# Patient Record
Sex: Female | Born: 1985 | Race: Black or African American | Hispanic: No | Marital: Single | Smoking: Never smoker
Health system: Southern US, Community
[De-identification: ages and names within clinical notes are randomized; demographics above are authoritative.]

## PROBLEM LIST (undated history)

## (undated) DIAGNOSIS — J189 Pneumonia, unspecified organism: Secondary | ICD-10-CM

## (undated) DIAGNOSIS — N179 Acute kidney failure, unspecified: Secondary | ICD-10-CM

## (undated) DIAGNOSIS — E669 Obesity, unspecified: Secondary | ICD-10-CM

## (undated) DIAGNOSIS — I10 Essential (primary) hypertension: Secondary | ICD-10-CM

## (undated) HISTORY — PX: CHOLECYSTECTOMY: SHX55

---

## 2012-04-26 ENCOUNTER — Emergency Department (HOSPITAL_COMMUNITY)
Admission: EM | Admit: 2012-04-26 | Discharge: 2012-04-26 | Disposition: A | Discharge status: Home / Self Care | Payer: 59 | Attending: Emergency Medicine | Admitting: Emergency Medicine

## 2012-04-26 ENCOUNTER — Encounter (HOSPITAL_COMMUNITY): Payer: Self-pay | Admitting: *Deleted

## 2012-04-26 DIAGNOSIS — N898 Other specified noninflammatory disorders of vagina: Secondary | ICD-10-CM | POA: Insufficient documentation

## 2012-04-26 DIAGNOSIS — N390 Urinary tract infection, site not specified: Secondary | ICD-10-CM | POA: Insufficient documentation

## 2012-04-26 LAB — URINE MICROSCOPIC-ADD ON

## 2012-04-26 LAB — WET PREP, GENITAL: Yeast Wet Prep HPF POC: NONE SEEN

## 2012-04-26 LAB — URINALYSIS, ROUTINE W REFLEX MICROSCOPIC
Bilirubin Urine: NEGATIVE
Glucose, UA: NEGATIVE mg/dL
Ketones, ur: NEGATIVE mg/dL
pH: 5.5 (ref 5.0–8.0)

## 2012-04-26 MED ORDER — SULFAMETHOXAZOLE-TRIMETHOPRIM 800-160 MG PO TABS: 1.0000 | ORAL_TABLET | Freq: Two times a day (BID) | ORAL | Status: DC

## 2012-04-26 MED ORDER — SULFAMETHOXAZOLE-TMP DS 800-160 MG PO TABS
1.0000 | ORAL_TABLET | Freq: Once | ORAL | Status: AC
  Administered 2012-04-26: 1 via ORAL
  Filled 2012-04-26: qty 1

## 2012-04-26 NOTE — ED Provider Notes (Signed)
History     CSN: [id number]  Arrival date & time 04/26/12  0024   First MD Initiated Contact with Patient 04/26/12 0133      Chief Complaint  Patient presents with  . Dysuria    (Consider location/radiation/quality/duration/timing/severity/associated sxs/prior treatment) HPI Comments: 26 year old female who states that on Monday she started having burning when she urinated. This has been persistent over 3 days, gradually worsening, it is associated with vaginal discharge which is increased and has an abnormal smell. She denies abdominal pain, nausea vomiting fevers chills or flank pain. She denies any history of sexual transmitted diseases and states that she is sexually active but does not feel that she is at risk for STDs.  Patient is a 26 y.o. female presenting with dysuria. The history is provided by the patient.  Dysuria  Pertinent negatives include no nausea.    History reviewed. No pertinent past medical history.  Past Surgical History  Procedure Date  . Cholecystectomy   . Cesarean section     No family history on file.  History  Substance Use Topics  . Smoking status: Never Smoker   . Smokeless tobacco: Not on file  . Alcohol Use: No    OB History    Grav Para Term Preterm Abortions TAB SAB Ect Mult Living                  Review of Systems  Constitutional: Negative for fever.  Gastrointestinal: Negative for nausea and abdominal pain.  Genitourinary: Positive for dysuria and vaginal discharge. Negative for vaginal pain.  Skin: Negative for rash.    Allergies  Review of patient's allergies indicates no known allergies.  Home Medications   Current Outpatient Rx  Name  Route  Sig  Dispense  Refill  . SULFAMETHOXAZOLE-TRIMETHOPRIM 800-160 MG PO TABS   Oral   Take 1 tablet by mouth every 12 (twelve) hours.   10 tablet   0     BP 137/90  Pulse 94  Temp 98.1 F (36.7 C) (Oral)  Resp 16  Ht 5\' 7"  (1.702 m)  Wt 260 lb (117.935 kg)  BMI 40.72  kg/m2  SpO2 98%  LMP 04/03/2012  Physical Exam  Nursing note and vitals reviewed. Constitutional: She appears well-developed and well-nourished. No distress.  HENT:  Head: Normocephalic and atraumatic.  Mouth/Throat: Oropharynx is clear and moist. No oropharyngeal exudate.  Eyes: Conjunctivae normal and EOM are normal. Pupils are equal, round, and reactive to light. Right eye exhibits no discharge. Left eye exhibits no discharge. No scleral icterus.  Neck: Normal range of motion. Neck supple. No JVD present. No thyromegaly present.  Cardiovascular: Normal rate, regular rhythm, normal heart sounds and intact distal pulses.  Exam reveals no gallop and no friction rub.   No murmur heard. Pulmonary/Chest: Effort normal and breath sounds normal. No respiratory distress. She has no wheezes. She has no rales.  Abdominal: Soft. Bowel sounds are normal. She exhibits no distension and no mass. There is no tenderness.       No abdominal tenderness  Genitourinary:       No tenderness on bimanual exam, mild to moderate vaginal discharge, no bleeding, no adnexal tenderness or masses  Musculoskeletal: Normal range of motion. She exhibits no edema and no tenderness.  Lymphadenopathy:    She has no cervical adenopathy.  Neurological: She is alert. Coordination normal.  Skin: Skin is warm and dry. No rash noted. No erythema.  Psychiatric: She has a normal mood and affect.  Her behavior is normal.    ED Course  Procedures (including critical care time)  Labs Reviewed  URINALYSIS, ROUTINE W REFLEX MICROSCOPIC - Abnormal; Notable for the following:    APPearance CLOUDY (*)     Hgb urine dipstick SMALL (*)     Leukocytes, UA MODERATE (*)     All other components within normal limits  URINE MICROSCOPIC-ADD ON - Abnormal; Notable for the following:    Squamous Epithelial / LPF MANY (*)     Bacteria, UA FEW (*)     All other components within normal limits  WET PREP, GENITAL - Abnormal; Notable for the  following:    Clue Cells Wet Prep HPF POC RARE (*)     WBC, Wet Prep HPF POC FEW (*)     All other components within normal limits  PREGNANCY, URINE  URINE CULTURE  GC/CHLAMYDIA PROBE AMP   No results found.   1. UTI (lower urinary tract infection)       MDM  Well-appearing, normal vital signs, urinalysis suggest urinary infection, 21-50 white blood cells with a few bacteria but many squamous cells. Moderate leukocyte esterase. Will check gonorrhea Chlamydia and wet prep, otherwise patient appears stable.  Urinalysis reviewed, wet prep reviewed, GC Chlamydia pending, patient likely has urinary infection, stable, Bactrim, discharge        Vida Roller, MD 04/26/12 [phone]

## 2012-04-26 NOTE — ED Notes (Signed)
Pt complains of dysuria x 3 days. Pt complains of foul smelling urine.

## 2012-04-26 NOTE — ED Notes (Signed)
Pelvic cart at bedside. 

## 2012-04-26 NOTE — ED Notes (Signed)
Pt c/o burning with urination x 3 days

## 2012-04-27 LAB — URINE CULTURE

## 2012-04-27 LAB — GC/CHLAMYDIA PROBE AMP: GC Probe RNA: NEGATIVE

## 2012-04-28 NOTE — ED Notes (Signed)
+  Urine. Patient treated with Septra DS. Sensitive to same. Per protocol MD. °

## 2012-04-29 ENCOUNTER — Other Ambulatory Visit (HOSPITAL_COMMUNITY)
Admission: RE | Admit: 2012-04-29 | Discharge: 2012-04-29 | Disposition: A | Discharge status: Home / Self Care | Payer: 59 | Source: Ambulatory Visit | Attending: Obstetrics and Gynecology | Admitting: Obstetrics and Gynecology

## 2012-04-29 ENCOUNTER — Other Ambulatory Visit: Payer: Self-pay | Admitting: Obstetrics and Gynecology

## 2012-04-29 DIAGNOSIS — Z01419 Encounter for gynecological examination (general) (routine) without abnormal findings: Secondary | ICD-10-CM | POA: Insufficient documentation

## 2012-10-16 ENCOUNTER — Other Ambulatory Visit: Payer: Self-pay | Admitting: Obstetrics & Gynecology

## 2012-10-16 DIAGNOSIS — N611 Abscess of the breast and nipple: Secondary | ICD-10-CM

## 2012-10-17 ENCOUNTER — Encounter (INDEPENDENT_AMBULATORY_CARE_PROVIDER_SITE_OTHER): Payer: Self-pay

## 2012-10-17 ENCOUNTER — Ambulatory Visit
Admission: RE | Admit: 2012-10-17 | Discharge: 2012-10-17 | Disposition: A | Discharge status: Home / Self Care | Payer: Self-pay | Source: Ambulatory Visit | Attending: Obstetrics & Gynecology | Admitting: Obstetrics & Gynecology

## 2012-10-17 ENCOUNTER — Ambulatory Visit
Admission: RE | Admit: 2012-10-17 | Discharge: 2012-10-17 | Disposition: A | Discharge status: Home / Self Care | Payer: BC Managed Care – PPO | Source: Ambulatory Visit | Attending: Obstetrics & Gynecology | Admitting: Obstetrics & Gynecology

## 2012-10-17 ENCOUNTER — Ambulatory Visit (INDEPENDENT_AMBULATORY_CARE_PROVIDER_SITE_OTHER): Payer: BC Managed Care – PPO | Admitting: Surgery

## 2012-10-17 ENCOUNTER — Encounter (INDEPENDENT_AMBULATORY_CARE_PROVIDER_SITE_OTHER): Payer: Self-pay | Admitting: Surgery

## 2012-10-17 VITALS — BP 122/72 | HR 86 | Temp 98.4°F | Resp 17 | Ht 66.0 in | Wt 289.2 lb

## 2012-10-17 DIAGNOSIS — N611 Abscess of the breast and nipple: Secondary | ICD-10-CM

## 2012-10-17 DIAGNOSIS — N61 Mastitis without abscess: Secondary | ICD-10-CM

## 2012-10-17 NOTE — Progress Notes (Signed)
General Surgery [facility]  Chief Complaint  Patient presents with  . New Evaluation    eval r breast absess - referral from Dr. Hulan Saas, [facility]    HISTORY: The patient is a 27 year old female who presents on referral from the radiologist with a right breast abscess. Patient has a previous history of right breast abscess which spontaneously drained approximately 1-1/2 years ago. Over the past week the patient has noted pain in the right breast with swelling and induration. She was seen by her primary care physician and started on oral antibiotics. She was given a two-week supply. Patient was sent to the radiology Center where an ultrasound of the breast was performed. This showed a abscess cavity measuring 3.3 cm at the medial aspect of the right areola. This was the same site as her prior abscess. An attempt was made at needle aspiration without success. Patient is now referred for surgical for incision and drainage.  History reviewed. No pertinent past medical history.   Current Outpatient Prescriptions  Medication Sig Dispense Refill  . cephALEXin (KEFLEX) 500 MG capsule Take 500 mg by mouth 2 (two) times daily.       No current facility-administered medications for this visit.     No Known Allergies   Family History  Problem Relation Age of Onset  . Hyperlipidemia Mother   . Heart disease Mother   . Hyperlipidemia Father      History   Social History  . Marital Status: Single    Spouse Name: N/A    Number of Children: N/A  . Years of Education: N/A   Social History Main Topics  . Smoking status: Current Every Day Smoker -- 0.25 packs/day  . Smokeless tobacco: Never Used  . Alcohol Use: Yes     Comment: everyday  . Drug Use: No  . Sexually Active: None   Other Topics Concern  . None   Social History Narrative  . None     REVIEW OF SYSTEMS - PERTINENT POSITIVES ONLY: Previous right breast abscess with  spontaneous drainage. Increasing right breast pain of 5 days' duration  EXAM: Filed Vitals:   10/17/12 1632  BP: 122/72  Pulse: 86  Temp: 98.4 F (36.9 C)  Resp: 17    HEENT: normocephalic; pupils equal and reactive; sclerae clear; dentition good; mucous membranes moist NECK:  symmetric on extension; no palpable anterior or posterior cervical lymphadenopathy; no supraclavicular masses; no tenderness CHEST: clear to auscultation bilaterally without rales, rhonchi, or wheezes CARDIAC: regular rate and rhythm without significant murmur; peripheral pulses are full BREAST: Right breast with obvious induration and erythema consistent with cellulitis involving the nipple areolar complex and the medial aspect of the right breast; mild fluctuance at site of previous scar on right breast at the edge of the right areolar located medially EXT:  non-tender without edema; no deformity NEURO: no gross focal deficits; no sign of tremor   LABORATORY RESULTS: See [facility] (CHL-Epic) for most recent results   RADIOLOGY RESULTS: See [facility] (CHL-Epic) for most recent results   PROCEDURE: Under aseptic conditions using local anesthetic, a 2 cm incision is made into the roof of the abscess cavity along the edge of the right areola medially. Dark purulent fluid is evacuated. Abscess cavity is packed with quarter inch iodoform gauze packing and dry gauze dressings are placed.   IMPRESSION: Right breast abscess, recurrent  PLAN: Patient will take her full course of Keflex as prescribed by  her primary care physician. Wound care instructions are given. Patient will remove the packing in 48-72 hours. She will continue to dress the wound until there is no further drainage.  Patient will return for surgical care as needed.  Velora Heckler, MD, FACS General & Endocrine Surgery [facility]   Visit Diagnoses: 1. Breast abscess, right     Primary Care  Physician: Genia Del, MD

## 2012-10-17 NOTE — Patient Instructions (Signed)
Changed external dressings as needed for continued drainage.  Remove packing from the wound in 48-72 hours.  Continue to shower and dressed the wound until there is no further drainage.  Take entire course of oral antibiotics as prescribed by your primary care physician.  Velora Heckler, MD, [facility] Office: [phone]

## 2012-10-21 ENCOUNTER — Ambulatory Visit (INDEPENDENT_AMBULATORY_CARE_PROVIDER_SITE_OTHER): Payer: BC Managed Care – PPO | Admitting: General Surgery

## 2012-10-21 ENCOUNTER — Encounter (INDEPENDENT_AMBULATORY_CARE_PROVIDER_SITE_OTHER): Payer: Self-pay

## 2012-10-21 ENCOUNTER — Encounter (INDEPENDENT_AMBULATORY_CARE_PROVIDER_SITE_OTHER): Payer: Self-pay | Admitting: General Surgery

## 2012-10-21 VITALS — BP 124/84 | HR 86 | Temp 96.4°F | Resp 16 | Ht 66.0 in | Wt 290.0 lb

## 2012-10-21 DIAGNOSIS — N611 Abscess of the breast and nipple: Secondary | ICD-10-CM

## 2012-10-21 DIAGNOSIS — N61 Mastitis without abscess: Secondary | ICD-10-CM

## 2012-10-21 MED ORDER — HYDROCODONE-ACETAMINOPHEN 5-325 MG PO TABS: 1.0000 | ORAL_TABLET | Freq: Four times a day (QID) | ORAL | Status: DC | PRN

## 2012-10-21 NOTE — Progress Notes (Signed)
Subjective:     Patient ID: Hannah Harvey, female   DOB: March 23, 1986, 27 y.o.   MRN: [id number]  HPI This patient follows up 3 days status post incision and drainage of right breast abscess by Dr. Gerrit Friends.  She has not been doing any wound care but she does say that the discomfort is improved. She continues taking her Keflex and denies any fevers  Review of Systems     Objective:   Physical Exam The right breast wound is doing okay. I changed the dressing and repacked the wound today in the office. She does not have any cellulitis but she does still have some surrounding induration. I do not appreciate any undrained infection    Assessment:     Status post incision and drainage of right breast abscess-improving this appears adequately drained and she remains on her antibiotics. We instructed her boyfriend had to care for the wound and packed the wound and he will pack the wound daily and will followup in about 2 weeks for repeat evaluation    Plan:     Continue antibiotics and wound care and followup in about 2 weeks for repeat evaluation  I recommended smoking cessation

## 2012-11-04 ENCOUNTER — Encounter (INDEPENDENT_AMBULATORY_CARE_PROVIDER_SITE_OTHER): Payer: BC Managed Care – PPO | Admitting: Surgery

## 2012-11-12 ENCOUNTER — Encounter (INDEPENDENT_AMBULATORY_CARE_PROVIDER_SITE_OTHER): Payer: BC Managed Care – PPO | Admitting: Surgery

## 2012-11-22 ENCOUNTER — Encounter (INDEPENDENT_AMBULATORY_CARE_PROVIDER_SITE_OTHER): Payer: Self-pay | Admitting: Surgery

## 2013-03-20 ENCOUNTER — Other Ambulatory Visit: Payer: Self-pay

## 2013-07-18 ENCOUNTER — Telehealth: Payer: Self-pay

## 2013-07-18 NOTE — Telephone Encounter (Signed)
Left message for call back Non-identifiable   Pap-04/29/12-negative Flu- Td-

## 2013-07-22 ENCOUNTER — Ambulatory Visit: Payer: Self-pay | Admitting: Family Medicine

## 2013-07-22 DIAGNOSIS — Z0289 Encounter for other administrative examinations: Secondary | ICD-10-CM

## 2013-07-30 NOTE — Telephone Encounter (Signed)
Unable to reach patient pre visit. Patient was no show.

## 2016-11-14 ENCOUNTER — Emergency Department (HOSPITAL_COMMUNITY)
Admission: EM | Admit: 2016-11-14 | Discharge: 2016-11-14 | Disposition: A | Discharge status: Home / Self Care | Payer: 59 | Attending: Emergency Medicine | Admitting: Emergency Medicine

## 2016-11-14 ENCOUNTER — Encounter (HOSPITAL_COMMUNITY): Payer: Self-pay | Admitting: Emergency Medicine

## 2016-11-14 DIAGNOSIS — F17200 Nicotine dependence, unspecified, uncomplicated: Secondary | ICD-10-CM | POA: Insufficient documentation

## 2016-11-14 DIAGNOSIS — J029 Acute pharyngitis, unspecified: Secondary | ICD-10-CM | POA: Diagnosis present

## 2016-11-14 DIAGNOSIS — K122 Cellulitis and abscess of mouth: Secondary | ICD-10-CM | POA: Diagnosis not present

## 2016-11-14 DIAGNOSIS — I10 Essential (primary) hypertension: Secondary | ICD-10-CM | POA: Insufficient documentation

## 2016-11-14 HISTORY — DX: Essential (primary) hypertension: I10

## 2016-11-14 LAB — RAPID STREP SCREEN (MED CTR MEBANE ONLY): Streptococcus, Group A Screen (Direct): NEGATIVE

## 2016-11-14 MED ORDER — ONDANSETRON HCL 4 MG/2ML IJ SOLN
8.0000 mg | Freq: Once | INTRAMUSCULAR | Status: AC
Start: 2016-11-14 — End: 2016-11-14
  Administered 2016-11-14: 8 mg via INTRAMUSCULAR
  Filled 2016-11-14: qty 4

## 2016-11-14 MED ORDER — ONDANSETRON 4 MG PO TBDP: 4.0000 mg | ORAL_TABLET | Freq: Three times a day (TID) | ORAL | 0 refills | Status: DC | PRN

## 2016-11-14 MED ORDER — PREDNISONE 10 MG PO TABS: 20.0000 mg | ORAL_TABLET | Freq: Every day | ORAL | 0 refills | Status: DC

## 2016-11-14 MED ORDER — DEXAMETHASONE 10 MG/ML FOR PEDIATRIC ORAL USE
10.0000 mg | Freq: Once | INTRAMUSCULAR | Status: AC
  Administered 2016-11-14: 10 mg via ORAL
  Filled 2016-11-14: qty 1

## 2016-11-14 NOTE — ED Triage Notes (Signed)
Patient reports sore throat with occasional productive cough onset this evening , denies fever or chills , airway intact /respirations unlabored .

## 2016-11-14 NOTE — ED Provider Notes (Signed)
[facility]-EMERGENCY DEPT Provider Note   CSN: [id number] Arrival date & time: 11/14/16  0334     History   Chief Complaint Chief Complaint  Patient presents with  . Sore Throat    HPI Hannah Harvey is a 31 y.o. female.  Patient started with sore throat last evening.  Denies any fever.  States that it difficult to swallow.  On arrival to the emergency room, she developed nausea and vomiting.      Past Medical History:  Diagnosis Date  . Hypertension     Patient Active Problem List   Diagnosis Date Noted  . Breast abscess, right 10/17/2012    Past Surgical History:  Procedure Laterality Date  . CESAREAN SECTION    . CHOLECYSTECTOMY      OB History    No data available       Home Medications    Prior to Admission medications   Medication Sig Start Date End Date Taking? Authorizing Provider  cephALEXin (KEFLEX) 500 MG capsule Take 500 mg by mouth 2 (two) times daily.    [provider]  HYDROcodone-acetaminophen (NORCO) 5-325 MG per tablet Take 1 tablet by mouth every 6 (six) hours as needed for pain. 10/21/12   Lodema PilotLayton, Brian, DO    Family History Family History  Problem Relation Age of Onset  . Hyperlipidemia Mother   . Heart disease Mother   . Hyperlipidemia Father     Social History Social History  Substance Use Topics  . Smoking status: Current Every Day Smoker    Packs/day: 0.25  . Smokeless tobacco: Never Used  . Alcohol use Yes     Comment: everyday     Allergies   Patient has no known allergies.   Review of Systems Review of Systems  Constitutional: Negative for fever.  HENT: Positive for sore throat and trouble swallowing.   Gastrointestinal: Positive for nausea and vomiting.  All other systems reviewed and are negative.    Physical Exam Updated Vital Signs BP (!) 152/132 (BP Location: Right Arm)   Pulse (!) 107   Temp 98.1 F (36.7 C) (Oral)   Resp 18   LMP 10/12/2016 (Approximate)   SpO2 100%   Physical  Exam  Constitutional: She appears well-developed and well-nourished.  HENT:  Head: Normocephalic.  Right Ear: External ear normal.  Left Ear: External ear normal.  Mouth/Throat: Oropharynx is clear and moist and mucous membranes are normal. No trismus in the jaw. Uvula swelling present.  Cardiovascular: Normal rate.   Pulmonary/Chest: Effort normal.  Abdominal: Soft.  Musculoskeletal: Normal range of motion.  Neurological: She is alert.  Skin: Skin is warm.  Psychiatric: She has a normal mood and affect.  Nursing note and vitals reviewed.    ED Treatments / Results  Labs (all labs ordered are listed, but only abnormal results are displayed) Labs Reviewed  RAPID STREP SCREEN (NOT AT [facility])    EKG  EKG Interpretation None       Radiology No results found.  Procedures Procedures (including critical care time)  Medications Ordered in ED Medications  ondansetron (ZOFRAN) injection 8 mg (not administered)     Initial Impression / Assessment and Plan / ED Course  I have reviewed the triage vital signs and the nursing notes.  Pertinent labs & imaging results that were available during my care of the patient were reviewed by me and considered in my medical decision making (see chart for details).      Patient has extremely edematous.  Uvula she'll be given Decadron and reassessed  Final Clinical Impressions(s) / ED Diagnoses   Final diagnoses:  None    New Prescriptions New Prescriptions   No medications on file     Earley Favor, NP 11/14/16 0500    Ward, Layla Maw, DO 11/14/16 [phone]

## 2016-11-14 NOTE — Discharge Instructions (Addendum)
You have been given a dose of a strong steroid in the emergency department to control the swelling You have been given a prescription for continued steroid therapy as well as antiemetics that you can use as needed.  Follow-up with your primary care doctor if you develop worsening symptoms more difficulty swallowing.  Please return for further evaluation

## 2016-11-16 LAB — CULTURE, GROUP A STREP (THRC)

## 2017-01-12 ENCOUNTER — Emergency Department (HOSPITAL_BASED_OUTPATIENT_CLINIC_OR_DEPARTMENT_OTHER)
Admission: EM | Admit: 2017-01-12 | Discharge: 2017-01-12 | Disposition: A | Discharge status: Home / Self Care | Payer: 59 | Attending: Emergency Medicine | Admitting: Emergency Medicine

## 2017-01-12 ENCOUNTER — Encounter (HOSPITAL_BASED_OUTPATIENT_CLINIC_OR_DEPARTMENT_OTHER): Payer: Self-pay | Admitting: Emergency Medicine

## 2017-01-12 DIAGNOSIS — F17210 Nicotine dependence, cigarettes, uncomplicated: Secondary | ICD-10-CM | POA: Diagnosis not present

## 2017-01-12 DIAGNOSIS — I10 Essential (primary) hypertension: Secondary | ICD-10-CM | POA: Insufficient documentation

## 2017-01-12 DIAGNOSIS — J392 Other diseases of pharynx: Secondary | ICD-10-CM | POA: Diagnosis not present

## 2017-01-12 MED ORDER — FAMOTIDINE IN NACL 20-0.9 MG/50ML-% IV SOLN
20.0000 mg | Freq: Once | INTRAVENOUS | Status: AC
  Administered 2017-01-12: 20 mg via INTRAVENOUS
  Filled 2017-01-12: qty 50

## 2017-01-12 MED ORDER — DIPHENHYDRAMINE HCL 50 MG/ML IJ SOLN
25.0000 mg | Freq: Once | INTRAMUSCULAR | Status: AC
  Administered 2017-01-12: 25 mg via INTRAVENOUS
  Filled 2017-01-12: qty 1

## 2017-01-12 MED ORDER — DEXAMETHASONE SODIUM PHOSPHATE 10 MG/ML IJ SOLN
10.0000 mg | Freq: Once | INTRAMUSCULAR | Status: AC
  Administered 2017-01-12: 10 mg via INTRAVENOUS
  Filled 2017-01-12: qty 1

## 2017-01-12 NOTE — ED Provider Notes (Signed)
[facility] Provider Note: Lowella DellJ. Lane Marnette Perkins, MD, FACEP  CSN: [id number] MRN: [id number] ARRIVAL: 01/12/17 at 0116 ROOM: [facility]03/[facility]03   CHIEF COMPLAINT  Oral Swelling   HISTORY OF PRESENT ILLNESS  01/12/17 1:34 AM Kendrick RanchDominique Jones-Hyslop is a 31 y.o. female who is on Lotrel for hypertension which includes benazepril. She is here with about a 4 hour history of edema in her throat. Symptoms are mild to moderate. She feels that her ability to swallow is somewhat impaired. She is not having difficulty breathing at the present time. There is some mild associated discomfort. She has not had a fever or associated lymphadenopathy. She had a similar episode in July of this year which was diagnosed as uvulitis. She has not taken anything for this.   Past Medical History:  Diagnosis Date  . Hypertension     Past Surgical History:  Procedure Laterality Date  . CESAREAN SECTION    . CHOLECYSTECTOMY      Family History  Problem Relation Age of Onset  . Hyperlipidemia Mother   . Heart disease Mother   . Hyperlipidemia Father     Social History  Substance Use Topics  . Smoking status: Current Every Day Smoker    Packs/day: 0.25  . Smokeless tobacco: Never Used  . Alcohol use Yes     Comment: everyday    Prior to Admission medications   Medication Sig Start Date End Date Taking? Authorizing Provider  amLODipine-benazepril (LOTREL) 5-20 MG capsule Take 1 capsule by mouth daily.   Yes [provider]    Allergies Patient has no known allergies.   REVIEW OF SYSTEMS  Negative except as noted here or in the History of Present Illness.   PHYSICAL EXAMINATION  Initial Vital Signs Blood pressure (!) 177/113, pulse 84, temperature 98.4 F (36.9 C), temperature source Oral, resp. rate 18, height 5' 5.5" (1.664 m), weight (!) 138.8 kg (306 lb), SpO2 100 %.  Examination General: Well-developed, obese female in no acute distress; appearance consistent with age of  record HENT: normocephalic; atraumatic; tonsillar edema without erythema or exudate; minimal edema of uvula which remains midline; no trismus; no dysphonia; no stridor; no angioedema of tongue or lips Eyes: pupils equal, round and reactive to light; extraocular muscles intact Neck: supple Heart: regular rate and rhythm Lungs: clear to auscultation bilaterally Abdomen: soft; nondistended; nontender; bowel sounds present Extremities: No deformity; full range of motion; pulses normal Neurologic: Awake, alert and oriented; motor function intact in all extremities and symmetric; no facial droop Skin: Warm and dry Psychiatric: Normal mood and affect   RESULTS  Summary of this visit's results, reviewed by myself:   EKG Interpretation  Date/Time:    Ventricular Rate:    PR Interval:    QRS Duration:   QT Interval:    QTC Calculation:   R Axis:     Text Interpretation:        Laboratory Studies: No results found for this or any previous visit (from the past 24 hour(s)). Imaging Studies: No results found.  ED COURSE  Nursing notes and initial vitals signs, including pulse oximetry, reviewed.  Vitals:   01/12/17 0122  BP: (!) 177/113  Pulse: 84  Resp: 18  Temp: 98.4 F (36.9 C)  TempSrc: Oral  SpO2: 100%  Weight: (!) 138.8 kg (306 lb)  Height: 5' 5.5" (1.664 m)   3:13 AM Patient feeling subjectively better after IV meds. No objective evidence of progression. She was advised to contact her PCP later  this morning and advised them we have a concern about possible benazepril-induced angioedema.  PROCEDURES    ED DIAGNOSES     ICD-10-CM   1. Pharyngeal edema J39.2        Rafeal Skibicki, Jonny Ruiz, MD 01/12/17 [phone]

## 2017-01-12 NOTE — ED Triage Notes (Signed)
Pt c/o swelling to her throat. Pt had uvulitis a few months ago and reports symptoms are consistent.

## 2017-05-26 ENCOUNTER — Encounter (HOSPITAL_BASED_OUTPATIENT_CLINIC_OR_DEPARTMENT_OTHER): Payer: Self-pay | Admitting: Emergency Medicine

## 2017-05-26 ENCOUNTER — Emergency Department (HOSPITAL_BASED_OUTPATIENT_CLINIC_OR_DEPARTMENT_OTHER)
Admission: EM | Admit: 2017-05-26 | Discharge: 2017-05-26 | Disposition: A | Discharge status: Home / Self Care | Payer: 59 | Attending: Emergency Medicine | Admitting: Emergency Medicine

## 2017-05-26 ENCOUNTER — Other Ambulatory Visit: Payer: Self-pay

## 2017-05-26 DIAGNOSIS — N611 Abscess of the breast and nipple: Secondary | ICD-10-CM | POA: Diagnosis not present

## 2017-05-26 DIAGNOSIS — N644 Mastodynia: Secondary | ICD-10-CM | POA: Diagnosis present

## 2017-05-26 DIAGNOSIS — N6310 Unspecified lump in the right breast, unspecified quadrant: Secondary | ICD-10-CM | POA: Diagnosis not present

## 2017-05-26 DIAGNOSIS — I10 Essential (primary) hypertension: Secondary | ICD-10-CM | POA: Insufficient documentation

## 2017-05-26 DIAGNOSIS — Z79899 Other long term (current) drug therapy: Secondary | ICD-10-CM | POA: Diagnosis not present

## 2017-05-26 DIAGNOSIS — F17210 Nicotine dependence, cigarettes, uncomplicated: Secondary | ICD-10-CM | POA: Diagnosis not present

## 2017-05-26 MED ORDER — SULFAMETHOXAZOLE-TRIMETHOPRIM 800-160 MG PO TABS: 1.0000 | ORAL_TABLET | Freq: Two times a day (BID) | ORAL | 0 refills | Status: AC

## 2017-05-26 MED ORDER — SULFAMETHOXAZOLE-TRIMETHOPRIM 800-160 MG PO TABS
1.0000 | ORAL_TABLET | Freq: Once | ORAL | Status: AC
  Administered 2017-05-26: 1 via ORAL
  Filled 2017-05-26: qty 1

## 2017-05-26 MED ORDER — LIDOCAINE-EPINEPHRINE 2 %-1:100000 IJ SOLN
20.0000 mL | Freq: Once | INTRAMUSCULAR | Status: AC
  Administered 2017-05-26: 20 mL

## 2017-05-26 NOTE — ED Notes (Signed)
ED Provider at bedside. 

## 2017-05-26 NOTE — ED Triage Notes (Signed)
Patient states that she has had an abscess to her right breast x 2 days

## 2017-05-26 NOTE — Discharge Instructions (Signed)
Please read and follow all provided instructions.  Your diagnoses today include:  1. Breast abscess    Tests performed today include:  Vital signs. See below for your results today.   Medications prescribed:   Bactrim (trimethoprim/sulfamethoxazole) - antibiotic  You have been prescribed an antibiotic medicine: take the entire course of medicine even if you are feeling better. Stopping early can cause the antibiotic not to work.  Take any prescribed medications only as directed.   Home care instructions:   Follow any educational materials contained in this packet  Follow-up instructions: Please follow-up with your primary care provider in the next 2-3 daysfor further evaluation of your symptoms.   Return instructions:  Return to the Emergency Department if you have:  Fever  Worsening symptoms  Worsening pain  Worsening swelling  Redness of the skin that moves away from the affected area, especially if it streaks away from the affected area   Any other emergent concerns  Your vital signs today were: BP (!) 178/104 (BP Location: Left Arm)    Pulse 82    Temp 98.3 F (36.8 C) (Oral)    Resp 18    Ht 5' 0.5" (1.537 m)    Wt 134.3 kg (296 lb)    LMP 05/15/2017    SpO2 100%    BMI 56.86 kg/m  If your blood pressure (BP) was elevated above 135/85 this visit, please have this repeated by your doctor within one month. --------------

## 2017-05-26 NOTE — ED Provider Notes (Signed)
[facility] EMERGENCY DEPARTMENT Provider Note   CSN: [id number] Arrival date & time: 05/26/17  0944     History   Chief Complaint Chief Complaint  Patient presents with  . Abscess    HPI Hannah Harvey is a 32 y.o. female.  Patient presents with worsening right breast pain and redness with swelling over the past 3 days.  Area became much more painful in the past 24 hours.  No fevers, nausea or vomiting.  Remote history of breast abscess about 6 years ago. The onset of this condition was acute. The course is constant. Aggravating factors: palpation. Alleviating factors: none.        Past Medical History:  Diagnosis Date  . Hypertension     Patient Active Problem List   Diagnosis Date Noted  . Breast abscess, right 10/17/2012    Past Surgical History:  Procedure Laterality Date  . CESAREAN SECTION    . CHOLECYSTECTOMY      OB History    No data available       Home Medications    Prior to Admission medications   Medication Sig Start Date End Date Taking? Authorizing Provider  amLODipine (NORVASC) 10 MG tablet Take 10 mg by mouth daily.   Yes [provider]  losartan (COZAAR) 50 MG tablet Take 50 mg by mouth daily.   Yes [provider]    Family History Family History  Problem Relation Age of Onset  . Hyperlipidemia Mother   . Heart disease Mother   . Hyperlipidemia Father     Social History Social History   Tobacco Use  . Smoking status: Current Every Day Smoker    Packs/day: 0.25  . Smokeless tobacco: Never Used  Substance Use Topics  . Alcohol use: Yes    Comment: everyday  . Drug use: No     Allergies   Patient has no known allergies.   Review of Systems Review of Systems  Constitutional: Negative for fever.  Gastrointestinal: Negative for nausea and vomiting.  Skin: Positive for color change.       Positive for abscess.  Hematological: Negative for adenopathy.     Physical Exam Updated  Vital Signs BP (!) 178/104 (BP Location: Left Arm)   Pulse 82   Temp 98.3 F (36.8 C) (Oral)   Resp 18   Ht 5' 0.5" (1.537 m)   Wt 134.3 kg (296 lb)   LMP 05/15/2017   SpO2 100%   BMI 56.86 kg/m   Physical Exam  Constitutional: She appears well-developed and well-nourished.  HENT:  Head: Normocephalic and atraumatic.  Eyes: Conjunctivae are normal. Right eye exhibits no discharge. Left eye exhibits no discharge.  Neck: Normal range of motion. Neck supple.  Cardiovascular: Normal rate, regular rhythm and normal heart sounds.  Pulmonary/Chest: Effort normal and breath sounds normal. No stridor. No respiratory distress. She has no wheezes.  Abdominal: Soft. There is no tenderness.  Neurological: She is alert.  Skin: Skin is warm and dry.  There is approximately 5cm diameter abscess with extension of cellulitis extending medially and superiorly from the nipple.  Psychiatric: She has a normal mood and affect.  Nursing note and vitals reviewed.    ED Treatments / Results  Labs (all labs ordered are listed, but only abnormal results are displayed) Labs Reviewed - No data to display  EKG  EKG Interpretation None       Radiology No results found.  Procedures .Marland KitchenIncision and Drainage Date/Time: 05/26/2017 12:13 PM Performed  by: Renne CriglerGeiple, Erland Vivas, PA-C Authorized by: Renne CriglerGeiple, Zechariah Bissonnette, PA-C   Consent:    Consent obtained:  Verbal   Consent given by:  Patient   Risks discussed:  Bleeding, pain, incomplete drainage and infection (cosmetic)   Alternatives discussed:  Delayed treatment Location:    Type:  Abscess   Location:  Trunk   Trunk location:  R breast Pre-procedure details:    Skin preparation:  Betadine Procedure details:    Incision types:  Stab incision   Incision depth:  Submucosal   Scalpel blade:  11   Wound management:  Probed and deloculated   Drainage:  Purulent   Drainage amount:  Copious   Packing materials:  1/4 in iodoform gauze Post-procedure  details:    Patient tolerance of procedure:  Tolerated well, no immediate complications   (including critical care time)  Medications Ordered in ED Medications  sulfamethoxazole-trimethoprim (BACTRIM DS,SEPTRA DS) 800-160 MG per tablet 1 tablet (not administered)  lidocaine-EPINEPHrine (XYLOCAINE W/EPI) 2 %-1:100000 (with pres) injection 20 mL (20 mLs Infiltration Given by Other 05/26/17 1158)     Initial Impression / Assessment and Plan / ED Course  I have reviewed the triage vital signs and the nursing notes.  Pertinent labs & imaging results that were available during my care of the patient were reviewed by me and considered in my medical decision making (see chart for details).     Patient seen and examined. D/w Dr. Silverio LayYao who will see. We discussed possibility of I&D, cosmetic concerns. Pt would like the area drained.   Vital signs reviewed and are as follows: BP (!) 178/104 (BP Location: Left Arm)   Pulse 82   Temp 98.3 F (36.8 C) (Oral)   Resp 18   Ht 5' 0.5" (1.537 m)   Wt 134.3 kg (296 lb)   LMP 05/15/2017   SpO2 100%   BMI 56.86 kg/m   EMERGENCY DEPARTMENT US SOFT TISSUE INTERPRETATION "Study: Limited Soft Tissue Ultrasound"  INDICATIONS: Pain and Soft tissue infection Multiple views of the body part were obtained in real-time with a multi-frequency linear probe  PERFORMED BY: Myself IMAGES ARCHIVED?: Yes SIDE:Right  BODY PART:Breast INTERPRETATION:  Abcess present and Cellulitis present  Pt seen with Dr. Silverio LayYao.   Pt tolerated drainage well. Plan: Bactrim, PCP f/u 2 days, breast center f/u when able.   12:15 PM The patient was urged to return to the Emergency Department urgently with worsening pain, swelling, expanding erythema especially if it streaks away from the affected area, fever, or if they have any other concerns.   The patient verbalized understanding and stated agreement with this plan.    Final Clinical Impressions(s) / ED Diagnoses   Final  diagnoses:  Breast abscess   Pt with large breast abscess and cellulitis. Small incision made given location with packing placed.  Patient tolerated well.  Home with antibiotics and PCP/breast Center follow-up.  ED Discharge Orders        Ordered    sulfamethoxazole-trimethoprim (BACTRIM DS,SEPTRA DS) 800-160 MG tablet  2 times daily     05/26/17 1211       Renne CriglerGeiple, Sincere Liuzzi, PA-C 05/26/17 1216    Charlynne PanderYao, David Hsienta, MD 05/27/17 [phone]

## 2017-10-11 ENCOUNTER — Encounter (HOSPITAL_BASED_OUTPATIENT_CLINIC_OR_DEPARTMENT_OTHER): Payer: Self-pay | Admitting: Emergency Medicine

## 2017-10-11 ENCOUNTER — Emergency Department (HOSPITAL_BASED_OUTPATIENT_CLINIC_OR_DEPARTMENT_OTHER)
Admission: EM | Admit: 2017-10-11 | Discharge: 2017-10-11 | Disposition: A | Discharge status: Home / Self Care | Payer: 59 | Attending: Emergency Medicine | Admitting: Emergency Medicine

## 2017-10-11 ENCOUNTER — Other Ambulatory Visit: Payer: Self-pay

## 2017-10-11 DIAGNOSIS — J069 Acute upper respiratory infection, unspecified: Secondary | ICD-10-CM | POA: Diagnosis not present

## 2017-10-11 DIAGNOSIS — Z79899 Other long term (current) drug therapy: Secondary | ICD-10-CM | POA: Diagnosis not present

## 2017-10-11 DIAGNOSIS — I10 Essential (primary) hypertension: Secondary | ICD-10-CM | POA: Diagnosis not present

## 2017-10-11 DIAGNOSIS — F17200 Nicotine dependence, unspecified, uncomplicated: Secondary | ICD-10-CM | POA: Diagnosis not present

## 2017-10-11 DIAGNOSIS — R05 Cough: Secondary | ICD-10-CM | POA: Diagnosis present

## 2017-10-11 DIAGNOSIS — B9789 Other viral agents as the cause of diseases classified elsewhere: Secondary | ICD-10-CM

## 2017-10-11 NOTE — ED Provider Notes (Signed)
[facility] EMERGENCY DEPARTMENT Provider Note   CSN: [id number] Arrival date & time: 10/11/17  0818     History   Chief Complaint Chief Complaint  Patient presents with  . URI    HPI Hannah Harvey is a 32 y.o. female.  32yo f w/ h/o HTN who p/w cough, sore throat, congestion. Yesterday she began having productive cough associated with nasal congestion, sore throat, intermittent headaches. No fevers. She had diarrhea last night and 1 episode of vomiting this morning, was later able to drink V-8 juice. Her fiance has been sick with similar sx, was seen here and diagnosed w/ virus. She denies any recent travel, rash, or tick bites. She took goody powder yesterday.   The history is provided by the patient.  URI      Past Medical History:  Diagnosis Date  . Hypertension     Patient Active Problem List   Diagnosis Date Noted  . Breast abscess, right 10/17/2012    Past Surgical History:  Procedure Laterality Date  . CESAREAN SECTION    . CHOLECYSTECTOMY       OB History   None      Home Medications    Prior to Admission medications   Medication Sig Start Date End Date Taking? Authorizing Provider  amLODipine (NORVASC) 10 MG tablet Take 10 mg by mouth daily.    [provider]  losartan (COZAAR) 50 MG tablet Take 50 mg by mouth daily.    [provider]    Family History Family History  Problem Relation Age of Onset  . Hyperlipidemia Mother   . Heart disease Mother   . Hyperlipidemia Father     Social History Social History   Tobacco Use  . Smoking status: Current Every Day Smoker    Packs/day: 0.25  . Smokeless tobacco: Never Used  Substance Use Topics  . Alcohol use: Yes    Comment: everyday  . Drug use: No     Allergies   Patient has no known allergies.   Review of Systems Review of Systems All other systems reviewed and are negative except that which was mentioned in HPI   Physical Exam Updated  Vital Signs BP (!) 161/99 (BP Location: Right Arm)   Pulse 85   Temp 98.2 F (36.8 C) (Oral)   Resp 18   Ht 5' 5.5" (1.664 m)   Wt (!) 138.3 kg (305 lb)   LMP 09/11/2017   SpO2 99%   BMI 49.98 kg/m   Physical Exam  Constitutional: She is oriented to person, place, and time. She appears well-developed and well-nourished. No distress.  HENT:  Head: Normocephalic and atraumatic.  Mouth/Throat: No oropharyngeal exudate.  Moist mucous membranes Mild erythema posterior oropharynx, no tonsillar asymmetry; + nasal congestion  Eyes: Pupils are equal, round, and reactive to light. Conjunctivae are normal.  Neck: Neck supple.  Cardiovascular: Normal rate, regular rhythm and normal heart sounds.  No murmur heard. Pulmonary/Chest: Effort normal and breath sounds normal.  Abdominal: Soft. Bowel sounds are normal. She exhibits no distension. There is no tenderness.  Musculoskeletal: She exhibits no edema.  Neurological: She is alert and oriented to person, place, and time.  Fluent speech  Skin: Skin is warm and dry. No rash noted.  Psychiatric: She has a normal mood and affect. Judgment normal.  Nursing note and vitals reviewed.    ED Treatments / Results  Labs (all labs ordered are listed, but only abnormal results are displayed) Labs Reviewed -  No data to display  EKG None  Radiology No results found.  Procedures Procedures (including critical care time)  Medications Ordered in ED Medications - No data to display   Initial Impression / Assessment and Plan / ED Course  I have reviewed the triage vital signs and the nursing notes.       Pt well appearing on exam w/ reassuring VS, clear breath sounds. Sx c/w viral URI especially given sick contact. Discussed supportive measures at home and extensively reviewed return precautions including breathing problems, severe vomiting, dehydration, or worsening condition. She voiced understanding.   Final Clinical Impressions(s) /  ED Diagnoses   Final diagnoses:  Viral URI with cough    ED Discharge Orders    None       Caterra Ostroff, Ambrose Finland, MD 10/11/17 [phone]

## 2017-10-11 NOTE — ED Triage Notes (Signed)
H/A, sore throat, productive cough and nasal congestion since yesterday

## 2017-10-31 ENCOUNTER — Emergency Department (HOSPITAL_BASED_OUTPATIENT_CLINIC_OR_DEPARTMENT_OTHER)
Admission: EM | Admit: 2017-10-31 | Discharge: 2017-10-31 | Disposition: A | Discharge status: Home / Self Care | Payer: 59 | Attending: Emergency Medicine | Admitting: Emergency Medicine

## 2017-10-31 ENCOUNTER — Other Ambulatory Visit: Payer: Self-pay

## 2017-10-31 ENCOUNTER — Encounter (HOSPITAL_BASED_OUTPATIENT_CLINIC_OR_DEPARTMENT_OTHER): Payer: Self-pay | Admitting: Emergency Medicine

## 2017-10-31 DIAGNOSIS — J069 Acute upper respiratory infection, unspecified: Secondary | ICD-10-CM | POA: Diagnosis not present

## 2017-10-31 DIAGNOSIS — I10 Essential (primary) hypertension: Secondary | ICD-10-CM | POA: Diagnosis not present

## 2017-10-31 DIAGNOSIS — R05 Cough: Secondary | ICD-10-CM | POA: Diagnosis present

## 2017-10-31 DIAGNOSIS — J00 Acute nasopharyngitis [common cold]: Secondary | ICD-10-CM | POA: Insufficient documentation

## 2017-10-31 DIAGNOSIS — F17210 Nicotine dependence, cigarettes, uncomplicated: Secondary | ICD-10-CM | POA: Diagnosis not present

## 2017-10-31 MED ORDER — FLUTICASONE PROPIONATE 50 MCG/ACT NA SUSP: 1.0000 | Freq: Every day | NASAL | 2 refills | Status: DC

## 2017-10-31 MED ORDER — ALBUTEROL SULFATE HFA 108 (90 BASE) MCG/ACT IN AERS: 1.0000 | INHALATION_SPRAY | Freq: Four times a day (QID) | RESPIRATORY_TRACT | 0 refills | Status: DC | PRN

## 2017-10-31 MED ORDER — LORATADINE 10 MG PO TABS: 10.0000 mg | ORAL_TABLET | Freq: Every day | ORAL | 0 refills | Status: DC

## 2017-10-31 MED ORDER — AEROCHAMBER PLUS W/MASK MISC: 2 refills | Status: DC

## 2017-10-31 NOTE — Discharge Instructions (Signed)
1.  Use Flonase daily for the next 2 to 4 weeks. 2.  Take Claritin daily for the next 7 days then as needed. 3.  You report that even when you are taking medications as prescribed, your blood pressure has been very difficult to manage despite trying different approaches and medications with your primary care provider.  Discuss referral to a nephrologist with your primary care provider since it sounds as if your blood pressure is not well controlled despite trying several different treatment options.  Good management of your blood pressure is extremely important for your long-term health.

## 2017-10-31 NOTE — ED Triage Notes (Signed)
Cough and congestion x 3 days

## 2017-10-31 NOTE — ED Provider Notes (Signed)
[facility] EMERGENCY DEPARTMENT Provider Note   CSN: [id number] Arrival date & time: 10/31/17  0755     History   Chief Complaint Chief Complaint  Patient presents with  . Cough    HPI Hannah Harvey is a 32 y.o. female.  HPI Patient reports she started getting scratchy throat, nasal drainage and cough 4 days ago.  She reports has had a lot of nasal drainage to going down the back of her throat.  No fever.  Intermittent dry cough peroxisomal's.  No shortness of breath or chest pain.  Patient reports at first she thought these were allergic symptoms and has been trying equate brand oxymetazoline without relief.  She reports she and all of her family members had cold symptoms about 6 weeks ago.  She reports those symptoms completely resolved and everybody got better including herself.  She reports that she is the only one who has redeveloped nasal congestion, scratchy throat and cough.  She works in a call center.  She reports she is inside most of the time.  She reports she has air conditioning in her home.  Patient does have history of hypertension.  She reports she is compliant with her medications.  She reports despite compliance her blood pressures are rarely well controlled.  She reports the systolic often runs in the 160s to 170s and diastolic usually in the 90s or greater.  She reports she has tried different regimens with her PCP and given them several months of trial with out much improvement.  Patient denies she has problems with headaches, chest pain, shortness of breath. Past Medical History:  Diagnosis Date  . Hypertension     Patient Active Problem List   Diagnosis Date Noted  . Breast abscess, right 10/17/2012    Past Surgical History:  Procedure Laterality Date  . CESAREAN SECTION    . CHOLECYSTECTOMY       OB History   None      Home Medications    Prior to Admission medications   Medication Sig Start Date End Date Taking? Authorizing  Provider  albuterol (PROVENTIL HFA;VENTOLIN HFA) 108 (90 Base) MCG/ACT inhaler Inhale 1-2 puffs into the lungs every 6 (six) hours as needed for wheezing or shortness of breath. 10/31/17   Arby Barrette, MD  amLODipine (NORVASC) 10 MG tablet Take 10 mg by mouth daily.    [provider]  fluticasone (FLONASE) 50 MCG/ACT nasal spray Place 1 spray into both nostrils daily. 10/31/17   Arby Barrette, MD  loratadine (CLARITIN) 10 MG tablet Take 1 tablet (10 mg total) by mouth daily. 10/31/17   Arby Barrette, MD  losartan (COZAAR) 50 MG tablet Take 50 mg by mouth daily.    [provider]  Spacer/Aero-Holding Chambers (AEROCHAMBER PLUS WITH MASK) inhaler Use as instructed 10/31/17   Arby Barrette, MD    Family History Family History  Problem Relation Age of Onset  . Hyperlipidemia Mother   . Heart disease Mother   . Hyperlipidemia Father     Social History Social History   Tobacco Use  . Smoking status: Current Every Day Smoker    Packs/day: 0.25  . Smokeless tobacco: Never Used  Substance Use Topics  . Alcohol use: Yes    Comment: everyday  . Drug use: No     Allergies   Patient has no known allergies.   Review of Systems Review of Systems 10 Systems reviewed and are negative for acute change except as noted in the HPI.  Physical Exam Updated Vital Signs BP (!) 148/108 (BP Location: Left Arm)   Pulse 88   Temp 98.2 F (36.8 C) (Oral)   Resp 20   Ht 5\' 6"  (1.676 m)   Wt (!) 136.5 kg (301 lb)   LMP 10/10/2017   SpO2 98%   BMI 48.58 kg/m   Physical Exam  Constitutional: She is oriented to person, place, and time. She appears well-developed and well-nourished.  Patient is alert and nontoxic.  Clinically well in appearance.  No respiratory distress.  Morbid obesity.  HENT:  Head: Normocephalic and atraumatic.  Bilateral TMs normal.  Posterior oropharynx no tonsillar erythema or exudate.  Uvula normal and midline.  Minimal amount of postnasal  drip in the posterior nasal oropharynx.  Eyes: Pupils are equal, round, and reactive to light. EOM are normal.  Neck: Neck supple.  Cardiovascular: Normal rate, regular rhythm, normal heart sounds and intact distal pulses.  Pulmonary/Chest: Effort normal and breath sounds normal.  Abdominal: Soft. Bowel sounds are normal. She exhibits no distension. There is no tenderness.  Musculoskeletal: Normal range of motion. She exhibits no edema.  Neurological: She is alert and oriented to person, place, and time. She has normal strength. Coordination normal. GCS eye subscore is 4. GCS verbal subscore is 5. GCS motor subscore is 6.  Skin: Skin is warm, dry and intact.  Psychiatric: She has a normal mood and affect.     ED Treatments / Results  Labs (all labs ordered are listed, but only abnormal results are displayed) Labs Reviewed - No data to display  EKG None  Radiology No results found.  Procedures Procedures (including critical care time)  Medications Ordered in ED Medications - No data to display   Initial Impression / Assessment and Plan / ED Course  I have reviewed the triage vital signs and the nursing notes.  Pertinent labs & imaging results that were available during my care of the patient were reviewed by me and considered in my medical decision making (see chart for details).      Final Clinical Impressions(s) / ED Diagnoses   Final diagnoses:  Acute rhinitis  Upper respiratory tract infection, unspecified type   Patient clinically well in appearance.  Normal physical exam except very mild postnasal drip.  No fever, no chest pain, no signs of acute pneumonia or bacterial etiology.  Most suspect seasonal rhinitis.  We will have the patient use Flonase and Claritin.  PRN albuterol inhaler provided.  Patient is counseled on avoiding NSAIDs due to history of hypertension.  Patient has no signs of endorgan damage at this time.  She does however describe chronically elevated  blood pressures despite compliance with medications and trial of several different types of medications.  Patient counseled to discuss possible referral to nephrology with her PCP as she describes medical compliance but persistently difficult to manage hypertension. ED Discharge Orders        Ordered    fluticasone (FLONASE) 50 MCG/ACT nasal spray  Daily     10/31/17 0847    loratadine (CLARITIN) 10 MG tablet  Daily     10/31/17 0847    albuterol (PROVENTIL HFA;VENTOLIN HFA) 108 (90 Base) MCG/ACT inhaler  Every 6 hours PRN     10/31/17 0847    Spacer/Aero-Holding Chambers (AEROCHAMBER PLUS WITH MASK) inhaler     10/31/17 [id number]       Arby BarrettePfeiffer, Aishani Kalis, MD 10/31/17 [phone]

## 2017-10-31 NOTE — ED Notes (Signed)
ED Provider at bedside. 

## 2017-12-06 ENCOUNTER — Emergency Department (HOSPITAL_BASED_OUTPATIENT_CLINIC_OR_DEPARTMENT_OTHER)
Admission: EM | Admit: 2017-12-06 | Discharge: 2017-12-06 | Disposition: A | Discharge status: Home / Self Care | Payer: 59 | Attending: Emergency Medicine | Admitting: Emergency Medicine

## 2017-12-06 ENCOUNTER — Encounter (HOSPITAL_BASED_OUTPATIENT_CLINIC_OR_DEPARTMENT_OTHER): Payer: Self-pay | Admitting: Emergency Medicine

## 2017-12-06 ENCOUNTER — Other Ambulatory Visit: Payer: Self-pay

## 2017-12-06 DIAGNOSIS — S80862A Insect bite (nonvenomous), left lower leg, initial encounter: Secondary | ICD-10-CM | POA: Diagnosis not present

## 2017-12-06 DIAGNOSIS — W57XXXA Bitten or stung by nonvenomous insect and other nonvenomous arthropods, initial encounter: Secondary | ICD-10-CM | POA: Insufficient documentation

## 2017-12-06 DIAGNOSIS — L03114 Cellulitis of left upper limb: Secondary | ICD-10-CM | POA: Diagnosis not present

## 2017-12-06 DIAGNOSIS — Z79899 Other long term (current) drug therapy: Secondary | ICD-10-CM | POA: Diagnosis not present

## 2017-12-06 DIAGNOSIS — S50862A Insect bite (nonvenomous) of left forearm, initial encounter: Secondary | ICD-10-CM | POA: Diagnosis not present

## 2017-12-06 DIAGNOSIS — F17200 Nicotine dependence, unspecified, uncomplicated: Secondary | ICD-10-CM | POA: Insufficient documentation

## 2017-12-06 DIAGNOSIS — Y9259 Other trade areas as the place of occurrence of the external cause: Secondary | ICD-10-CM | POA: Insufficient documentation

## 2017-12-06 DIAGNOSIS — Y999 Unspecified external cause status: Secondary | ICD-10-CM | POA: Diagnosis not present

## 2017-12-06 DIAGNOSIS — Y939 Activity, unspecified: Secondary | ICD-10-CM | POA: Insufficient documentation

## 2017-12-06 DIAGNOSIS — I10 Essential (primary) hypertension: Secondary | ICD-10-CM | POA: Diagnosis not present

## 2017-12-06 MED ORDER — DOXYCYCLINE HYCLATE 100 MG PO CAPS: 100.0000 mg | ORAL_CAPSULE | Freq: Two times a day (BID) | ORAL | 0 refills | Status: DC

## 2017-12-06 NOTE — Discharge Instructions (Signed)
Take antibiotics as prescribed.  Take the entire course, even if your symptoms improve. Take ibuprofen 3 times a day with meals as needed for pain or swelling. You may supplement with Tylenol if you need further pain control. Use ice packs for pain and swelling. Follow-up with your primary care doctor if her symptoms are not improving. Return to the emergency room if you develop high fevers, persistent vomiting, numbness, or any new or concerning symptoms.

## 2017-12-06 NOTE — ED Triage Notes (Signed)
Pt reports multiple insect bites to L forearm since Sunday.

## 2017-12-07 NOTE — ED Provider Notes (Signed)
[facility] EMERGENCY DEPARTMENT Provider Note   CSN: [id number] Arrival date & time: 12/06/17  1725     History   Chief Complaint Chief Complaint  Patient presents with  . Insect Bite    HPI Hannah Harvey is a 32 y.o. female presenting for evaluation of left forearm pain after an insect bite.  Patient states that several days ago, she was bitten multiple times in the left arm and the left leg.  She was staying at a hotel at the time, and thinks this was the source of the bites.  Symptoms had been improving, although were intermittently itchy until today, when 1 of the bite started to become more painful.  Surrounding skin became harder and there is a small blister at the centralized area.  No drainage.  She denies fevers, chills, nausea, vomiting.  She has a history of high blood pressure for which she takes medication, no other medical problems.  She is not immunocompromised.  She has not taken anything for pain including Tylenol or ibuprofen.  Palpation makes the pain worse, nothing makes it better.  No other lesions are painful.  HPI  Past Medical History:  Diagnosis Date  . Hypertension     Patient Active Problem List   Diagnosis Date Noted  . Breast abscess, right 10/17/2012    Past Surgical History:  Procedure Laterality Date  . CESAREAN SECTION    . CHOLECYSTECTOMY       OB History   None      Home Medications    Prior to Admission medications   Medication Sig Start Date End Date Taking? Authorizing Provider  albuterol (PROVENTIL HFA;VENTOLIN HFA) 108 (90 Base) MCG/ACT inhaler Inhale 1-2 puffs into the lungs every 6 (six) hours as needed for wheezing or shortness of breath. 10/31/17   Arby Barrette, MD  amLODipine (NORVASC) 10 MG tablet Take 10 mg by mouth daily.    [provider]  doxycycline (VIBRAMYCIN) 100 MG capsule Take 1 capsule (100 mg total) by mouth 2 (two) times daily. 12/06/17   Mattheo Swindle, PA-C  fluticasone  (FLONASE) 50 MCG/ACT nasal spray Place 1 spray into both nostrils daily. 10/31/17   Arby Barrette, MD  loratadine (CLARITIN) 10 MG tablet Take 1 tablet (10 mg total) by mouth daily. 10/31/17   Arby Barrette, MD  losartan (COZAAR) 50 MG tablet Take 50 mg by mouth daily.    [provider]  Spacer/Aero-Holding Chambers (AEROCHAMBER PLUS WITH MASK) inhaler Use as instructed 10/31/17   Arby Barrette, MD    Family History Family History  Problem Relation Age of Onset  . Hyperlipidemia Mother   . Heart disease Mother   . Hyperlipidemia Father     Social History Social History   Tobacco Use  . Smoking status: Current Every Day Smoker    Packs/day: 0.25  . Smokeless tobacco: Never Used  Substance Use Topics  . Alcohol use: Yes    Comment: everyday  . Drug use: No     Allergies   Patient has no known allergies.   Review of Systems Review of Systems  Skin: Positive for color change and rash.  Allergic/Immunologic: Negative for immunocompromised state.  Hematological: Does not bruise/bleed easily.     Physical Exam Updated Vital Signs BP (!) 163/102 (BP Location: Left Arm)   Pulse 88   Temp 98.4 F (36.9 C) (Oral)   Resp 18   Ht 5\' 5"  (1.651 m)   Wt (!) 140.6 kg (310 lb)  LMP 11/10/2017   SpO2 98%   BMI 51.59 kg/m   Physical Exam  Constitutional: She is oriented to person, place, and time. She appears well-developed and well-nourished. No distress.  HENT:  Head: Normocephalic and atraumatic.  Eyes: EOM are normal.  Neck: Normal range of motion.  Pulmonary/Chest: Effort normal.  Abdominal: She exhibits no distension.  Musculoskeletal: Normal range of motion.  Neurological: She is alert and oriented to person, place, and time.  Skin: Skin is warm. Capillary refill takes less than 2 seconds.  Multiple lesions on the left forearm.  One lesion is tender with centralized blistering and surrounding induration.  No drainage.  Radial pulses intact  bilaterally.  Grip strength intact bilaterally.  Soft compartments.  Psychiatric: She has a normal mood and affect.  Nursing note and vitals reviewed.    ED Treatments / Results  Labs (all labs ordered are listed, but only abnormal results are displayed) Labs Reviewed - No data to display  EKG None  Radiology No results found.   EMERGENCY DEPARTMENT US SOFT TISSUE INTERPRETATION "Study: Limited Soft Tissue Ultrasound"  INDICATIONS: Pain and Soft tissue infection Multiple views of the body part were obtained in real-time with a multi-frequency linear probe  PERFORMED BY: Myself IMAGES ARCHIVED?: Yes SIDE:Left BODY PART:Upper extremity INTERPRETATION:  No abcess noted and Cellulitis present     Procedures Procedures (including critical care time)  Medications Ordered in ED Medications - No data to display   Initial Impression / Assessment and Plan / ED Course  I have reviewed the triage vital signs and the nursing notes.  Pertinent labs & imaging results that were available during my care of the patient were reviewed by me and considered in my medical decision making (see chart for details).     Pt presenting for evaluation of left forearm pain at the site of an insect bite.  Physical exam patient was afebrile not tachycardic.  Appears nontoxic.  One lesion with surrounding induration, tenderness, and centralized blistering.  No drainage.  Ultrasound negative for abscess.  Likely early cellulitis.  Will treat with Keflex.  Discussed use of warm compresses and NSAIDs for symptom control.  Discussed follow-up with PCP if symptoms are not improving.  At this time, patient appears safe for discharge.  Return precautions given.  Patient states she understands and agrees to plan.  Final Clinical Impressions(s) / ED Diagnoses   Final diagnoses:  Cellulitis of left upper extremity  Insect bite of left forearm, initial encounter    ED Discharge Orders        Ordered     doxycycline (VIBRAMYCIN) 100 MG capsule  2 times daily     12/06/17 1757       Alveria ApleyCaccavale, Nico Syme, PA-C 12/07/17 0029    Tegeler, Canary Brimhristopher J, MD 12/07/17 0104

## 2018-03-04 ENCOUNTER — Emergency Department (HOSPITAL_BASED_OUTPATIENT_CLINIC_OR_DEPARTMENT_OTHER)
Admission: EM | Admit: 2018-03-04 | Discharge: 2018-03-04 | Disposition: A | Discharge status: Home / Self Care | Payer: 59 | Attending: Emergency Medicine | Admitting: Emergency Medicine

## 2018-03-04 ENCOUNTER — Encounter (HOSPITAL_BASED_OUTPATIENT_CLINIC_OR_DEPARTMENT_OTHER): Payer: Self-pay | Admitting: *Deleted

## 2018-03-04 ENCOUNTER — Other Ambulatory Visit: Payer: Self-pay

## 2018-03-04 DIAGNOSIS — I10 Essential (primary) hypertension: Secondary | ICD-10-CM | POA: Insufficient documentation

## 2018-03-04 DIAGNOSIS — Z79899 Other long term (current) drug therapy: Secondary | ICD-10-CM | POA: Insufficient documentation

## 2018-03-04 DIAGNOSIS — R300 Dysuria: Secondary | ICD-10-CM | POA: Diagnosis not present

## 2018-03-04 DIAGNOSIS — F17200 Nicotine dependence, unspecified, uncomplicated: Secondary | ICD-10-CM | POA: Diagnosis not present

## 2018-03-04 LAB — URINALYSIS, ROUTINE W REFLEX MICROSCOPIC
BILIRUBIN URINE: NEGATIVE
GLUCOSE, UA: NEGATIVE mg/dL
KETONES UR: NEGATIVE mg/dL
Nitrite: NEGATIVE
PH: 6 (ref 5.0–8.0)
Protein, ur: NEGATIVE mg/dL
Specific Gravity, Urine: 1.025 (ref 1.005–1.030)

## 2018-03-04 LAB — URINALYSIS, MICROSCOPIC (REFLEX)

## 2018-03-04 LAB — PREGNANCY, URINE: Preg Test, Ur: NEGATIVE

## 2018-03-04 MED ORDER — PHENAZOPYRIDINE HCL 200 MG PO TABS: 200.0000 mg | ORAL_TABLET | Freq: Three times a day (TID) | ORAL | 0 refills | Status: DC

## 2018-03-04 MED ORDER — NITROFURANTOIN MONOHYD MACRO 100 MG PO CAPS: 100.0000 mg | ORAL_CAPSULE | Freq: Two times a day (BID) | ORAL | 0 refills | Status: DC

## 2018-03-04 NOTE — Discharge Instructions (Addendum)
You have been seen today for your complaint of pain with urination. °Your lab work showed urine infection. °Your discharge medications include °1) macrobid °Please take all of your antibiotics until finished!   You may develop abdominal discomfort or diarrhea from the antibiotic.  You may help offset this with probiotics which you can buy or get in yogurt. Do not eat  or take the probiotics until 2 hours after your antibiotic.  °2) Pyridium °This medication will help relieve pain and burning but does not treat the infection.  Make sure that you wear a panty liner as it may stain your underwear. °Home care instructions are as follows:  °1) please drink plenty of water, avoid tea and beverages with caffeine like coffee or soda °2) if you are sexually active, ,make sure to urinate immediately after intercourse °Follow up with: your doctor or the emergency department °Please seek immediate medical care if you develop any of the following symptoms: °SEEK MEDICAL CARE IF:  °You have back pain.  °You develop a fever.  °Your symptoms do not begin to resolve within 3 days.  °SEEK IMMEDIATE MEDICAL CARE IF:  °You have severe back pain or lower abdominal pain.  °You develop chills.  °You have nausea or vomiting.  °You have continued burning or discomfort with urination. ° °

## 2018-03-04 NOTE — ED Triage Notes (Signed)
Dysuria x 2 days.

## 2018-03-04 NOTE — ED Notes (Signed)
Patient denies pain and is resting comfortably.  

## 2018-03-06 NOTE — ED Provider Notes (Signed)
[facility] EMERGENCY DEPARTMENT Provider Note   CSN: [id number] Arrival date & time: 03/04/18  1224     History   Chief Complaint Chief Complaint  Patient presents with  . Dysuria    HPI Hannah Harvey is a 32 y.o. female.  HPI 32 year old female past medical history significant for hypertension presents to the emergency department today for evaluation of dysuria.  She states that 2 days ago she developed some discomfort with urination.  She reports history of prior UTIs and states this feels similar.  She denies any hematuria but does report some urgency and frequency.  Denies any abdominal pain, flank pain, nausea, vomiting, fevers.  She reports vaginal discharge at baseline but no new vaginal discharge.  She denies any concern for STD.  Denies any change in her bowel habits.  She has not taken anything for her symptoms prior to arrival.  Nothing makes better or worse. Past Medical History:  Diagnosis Date  . Hypertension     Patient Active Problem List   Diagnosis Date Noted  . Breast abscess, right 10/17/2012    Past Surgical History:  Procedure Laterality Date  . CESAREAN SECTION    . CHOLECYSTECTOMY       OB History   None      Home Medications    Prior to Admission medications   Medication Sig Start Date End Date Taking? Authorizing Provider  amLODipine (NORVASC) 10 MG tablet Take 10 mg by mouth daily.   Yes [provider]  losartan (COZAAR) 50 MG tablet Take 50 mg by mouth daily.   Yes [provider]  albuterol (PROVENTIL HFA;VENTOLIN HFA) 108 (90 Base) MCG/ACT inhaler Inhale 1-2 puffs into the lungs every 6 (six) hours as needed for wheezing or shortness of breath. 10/31/17   Arby Barrette, MD  doxycycline (VIBRAMYCIN) 100 MG capsule Take 1 capsule (100 mg total) by mouth 2 (two) times daily. 12/06/17   Caccavale, Sophia, PA-C  fluticasone (FLONASE) 50 MCG/ACT nasal spray Place 1 spray into both nostrils daily. 10/31/17    Arby Barrette, MD  loratadine (CLARITIN) 10 MG tablet Take 1 tablet (10 mg total) by mouth daily. 10/31/17   Arby Barrette, MD  nitrofurantoin, macrocrystal-monohydrate, (MACROBID) 100 MG capsule Take 1 capsule (100 mg total) by mouth 2 (two) times daily. 03/04/18   Rise Mu, PA-C  phenazopyridine (PYRIDIUM) 200 MG tablet Take 1 tablet (200 mg total) by mouth 3 (three) times daily. 03/04/18   Rise Mu, PA-C  Spacer/Aero-Holding Chambers (AEROCHAMBER PLUS WITH MASK) inhaler Use as instructed 10/31/17   Arby Barrette, MD    Family History Family History  Problem Relation Age of Onset  . Hyperlipidemia Mother   . Heart disease Mother   . Hyperlipidemia Father     Social History Social History   Tobacco Use  . Smoking status: Current Every Day Smoker    Packs/day: 0.25  . Smokeless tobacco: Never Used  Substance Use Topics  . Alcohol use: Yes    Comment: everyday  . Drug use: No     Allergies   Patient has no known allergies.   Review of Systems Review of Systems  Constitutional: Negative for chills and fever.  Gastrointestinal: Negative for abdominal pain, diarrhea, nausea and vomiting.  Genitourinary: Positive for dysuria, frequency and urgency. Negative for flank pain, hematuria, pelvic pain and vaginal bleeding.  Skin: Negative for rash.     Physical Exam Updated Vital Signs BP (!) 167/102 (BP Location: Right Arm)  Pulse 82   Temp 98.3 F (36.8 C) (Oral)   Resp 20   Ht 5' 5.5" (1.664 m)   Wt (!) 137 kg   LMP 02/25/2018   SpO2 99%   BMI 49.49 kg/m   Physical Exam  Constitutional: She appears well-developed and well-nourished. No distress.  HENT:  Head: Normocephalic and atraumatic.  Eyes: Right eye exhibits no discharge. Left eye exhibits no discharge. No scleral icterus.  Neck: Normal range of motion.  Pulmonary/Chest: No respiratory distress.  Abdominal: Soft. Bowel sounds are normal. She exhibits no distension and no mass.  There is no tenderness. There is no rebound and no guarding.  No CVA tenderness.  No focal abdominal tenderness.  Musculoskeletal: Normal range of motion.  Neurological: She is alert.  Skin: No pallor.  Psychiatric: Her behavior is normal. Judgment and thought content normal.  Nursing note and vitals reviewed.    ED Treatments / Results  Labs (all labs ordered are listed, but only abnormal results are displayed) Labs Reviewed  URINALYSIS, ROUTINE W REFLEX MICROSCOPIC - Abnormal; Notable for the following components:      Result Value   Hgb urine dipstick MODERATE (*)    Leukocytes, UA SMALL (*)    All other components within normal limits  URINALYSIS, MICROSCOPIC (REFLEX) - Abnormal; Notable for the following components:   Bacteria, UA MANY (*)    All other components within normal limits  PREGNANCY, URINE    EKG None  Radiology No results found.  Procedures Procedures (including critical care time)  Medications Ordered in ED Medications - No data to display   Initial Impression / Assessment and Plan / ED Course  I have reviewed the triage vital signs and the nursing notes.  Pertinent labs & imaging results that were available during my care of the patient were reviewed by me and considered in my medical decision making (see chart for details).     Pt has been diagnosed with a UTI. Pt is afebrile, no CVA tenderness, normotensive, and denies N/V.  Doubt pyelonephritis, ureterolithiasis, PID, ectopic pregnancy.  Pt to be dc home with antibiotics and instructions to follow up with PCP if symptoms persist.  Pt is hemodynamically stable, in NAD, & able to ambulate in the ED. Evaluation does not show pathology that would require ongoing emergent intervention or inpatient treatment. I explained the diagnosis to the patient. Pain has been managed & has no complaints prior to dc. Pt is comfortable with above plan and is stable for discharge at this time. All questions were  answered prior to disposition. Strict return precautions for f/u to the ED were discussed. Encouraged follow up with PCP.    Final Clinical Impressions(s) / ED Diagnoses   Final diagnoses:  Dysuria    ED Discharge Orders         Ordered    nitrofurantoin, macrocrystal-monohydrate, (MACROBID) 100 MG capsule  2 times daily     03/04/18 1607    phenazopyridine (PYRIDIUM) 200 MG tablet  3 times daily     03/04/18 1607           Rise Mu, PA-C 03/06/18 4098    Little, Ambrose Finland, MD 03/07/18 1501

## 2018-04-15 ENCOUNTER — Emergency Department (HOSPITAL_BASED_OUTPATIENT_CLINIC_OR_DEPARTMENT_OTHER): Payer: 59

## 2018-04-15 ENCOUNTER — Inpatient Hospital Stay
Admission: AD | Admit: 2018-04-15 | Payer: Self-pay | Source: Other Acute Inpatient Hospital | Admitting: Pulmonary Disease

## 2018-04-15 ENCOUNTER — Other Ambulatory Visit: Payer: Self-pay

## 2018-04-15 ENCOUNTER — Inpatient Hospital Stay (HOSPITAL_BASED_OUTPATIENT_CLINIC_OR_DEPARTMENT_OTHER)
Admission: EM | Admit: 2018-04-15 | Discharge: 2018-04-23 | DRG: 871 | Disposition: A | Discharge status: Home / Self Care | Payer: 59 | Attending: Family Medicine | Admitting: Family Medicine

## 2018-04-15 ENCOUNTER — Encounter (HOSPITAL_BASED_OUTPATIENT_CLINIC_OR_DEPARTMENT_OTHER): Payer: Self-pay | Admitting: Emergency Medicine

## 2018-04-15 DIAGNOSIS — I10 Essential (primary) hypertension: Secondary | ICD-10-CM | POA: Diagnosis present

## 2018-04-15 DIAGNOSIS — R6521 Severe sepsis with septic shock: Secondary | ICD-10-CM | POA: Diagnosis present

## 2018-04-15 DIAGNOSIS — E872 Acidosis: Secondary | ICD-10-CM | POA: Diagnosis present

## 2018-04-15 DIAGNOSIS — A419 Sepsis, unspecified organism: Secondary | ICD-10-CM | POA: Diagnosis present

## 2018-04-15 DIAGNOSIS — E8770 Fluid overload, unspecified: Secondary | ICD-10-CM | POA: Diagnosis not present

## 2018-04-15 DIAGNOSIS — Z7951 Long term (current) use of inhaled steroids: Secondary | ICD-10-CM

## 2018-04-15 DIAGNOSIS — N179 Acute kidney failure, unspecified: Secondary | ICD-10-CM | POA: Diagnosis present

## 2018-04-15 DIAGNOSIS — J9690 Respiratory failure, unspecified, unspecified whether with hypoxia or hypercapnia: Secondary | ICD-10-CM | POA: Insufficient documentation

## 2018-04-15 DIAGNOSIS — Z79899 Other long term (current) drug therapy: Secondary | ICD-10-CM

## 2018-04-15 DIAGNOSIS — R6520 Severe sepsis without septic shock: Secondary | ICD-10-CM | POA: Diagnosis not present

## 2018-04-15 DIAGNOSIS — J851 Abscess of lung with pneumonia: Secondary | ICD-10-CM | POA: Diagnosis present

## 2018-04-15 DIAGNOSIS — B952 Enterococcus as the cause of diseases classified elsewhere: Secondary | ICD-10-CM | POA: Diagnosis present

## 2018-04-15 DIAGNOSIS — R0602 Shortness of breath: Secondary | ICD-10-CM

## 2018-04-15 DIAGNOSIS — Z6841 Body Mass Index (BMI) 40.0 and over, adult: Secondary | ICD-10-CM | POA: Diagnosis not present

## 2018-04-15 DIAGNOSIS — F17210 Nicotine dependence, cigarettes, uncomplicated: Secondary | ICD-10-CM | POA: Diagnosis present

## 2018-04-15 DIAGNOSIS — J154 Pneumonia due to other streptococci: Secondary | ICD-10-CM | POA: Diagnosis present

## 2018-04-15 DIAGNOSIS — G4733 Obstructive sleep apnea (adult) (pediatric): Secondary | ICD-10-CM | POA: Diagnosis present

## 2018-04-15 DIAGNOSIS — D72829 Elevated white blood cell count, unspecified: Secondary | ICD-10-CM | POA: Diagnosis not present

## 2018-04-15 DIAGNOSIS — J9691 Respiratory failure, unspecified with hypoxia: Secondary | ICD-10-CM

## 2018-04-15 DIAGNOSIS — J9601 Acute respiratory failure with hypoxia: Secondary | ICD-10-CM | POA: Diagnosis present

## 2018-04-15 DIAGNOSIS — A400 Sepsis due to streptococcus, group A: Secondary | ICD-10-CM | POA: Diagnosis not present

## 2018-04-15 DIAGNOSIS — J9600 Acute respiratory failure, unspecified whether with hypoxia or hypercapnia: Secondary | ICD-10-CM | POA: Diagnosis present

## 2018-04-15 DIAGNOSIS — J189 Pneumonia, unspecified organism: Secondary | ICD-10-CM

## 2018-04-15 DIAGNOSIS — D649 Anemia, unspecified: Secondary | ICD-10-CM | POA: Diagnosis present

## 2018-04-15 DIAGNOSIS — N390 Urinary tract infection, site not specified: Secondary | ICD-10-CM | POA: Diagnosis present

## 2018-04-15 DIAGNOSIS — J852 Abscess of lung without pneumonia: Secondary | ICD-10-CM | POA: Diagnosis present

## 2018-04-15 LAB — CBC WITH DIFFERENTIAL/PLATELET
Abs Immature Granulocytes: 0.01 10*3/uL (ref 0.00–0.07)
BASOS ABS: 0 10*3/uL (ref 0.0–0.1)
BASOS PCT: 1 %
EOS PCT: 1 %
Eosinophils Absolute: 0 10*3/uL (ref 0.0–0.5)
HCT: 37.9 % (ref 36.0–46.0)
Hemoglobin: 11.5 g/dL — ABNORMAL LOW (ref 12.0–15.0)
Immature Granulocytes: 0 %
LYMPHS PCT: 16 %
Lymphs Abs: 1.1 10*3/uL (ref 0.7–4.0)
MCH: 24.5 pg — ABNORMAL LOW (ref 26.0–34.0)
MCHC: 30.3 g/dL (ref 30.0–36.0)
MCV: 80.8 fL (ref 80.0–100.0)
MONO ABS: 0.2 10*3/uL (ref 0.1–1.0)
Monocytes Relative: 2 %
NEUTROS ABS: 5.4 10*3/uL (ref 1.7–7.7)
NRBC: 0 % (ref 0.0–0.2)
Neutrophils Relative %: 80 %
PLATELETS: 293 10*3/uL (ref 150–400)
RBC: 4.69 MIL/uL (ref 3.87–5.11)
RDW: 15.3 % (ref 11.5–15.5)
WBC Morphology: INCREASED
WBC: 6.6 10*3/uL (ref 4.0–10.5)

## 2018-04-15 LAB — POCT I-STAT 3, ART BLOOD GAS (G3+)
Acid-base deficit: 9 mmol/L — ABNORMAL HIGH (ref 0.0–2.0)
Acid-base deficit: 12 mmol/L — ABNORMAL HIGH (ref 0.0–2.0)
Bicarbonate: 15.2 mmol/L — ABNORMAL LOW (ref 20.0–28.0)
Bicarbonate: 13.5 mmol/L — ABNORMAL LOW (ref 20.0–28.0)
O2 SAT: 94 %
O2 Saturation: 97 %
PO2 ART: 77 mmHg — AB (ref 83.0–108.0)
Patient temperature: 98.8
Patient temperature: 36.8
TCO2: 16 mmol/L — ABNORMAL LOW (ref 22–32)
TCO2: 14 mmol/L — ABNORMAL LOW (ref 22–32)
pCO2 arterial: 26.4 mmHg — ABNORMAL LOW (ref 32.0–48.0)
pCO2 arterial: 28 mmHg — ABNORMAL LOW (ref 32.0–48.0)
pH, Arterial: 7.367 (ref 7.350–7.450)
pH, Arterial: 7.291 — ABNORMAL LOW (ref 7.350–7.450)
pO2, Arterial: 90 mmHg (ref 83.0–108.0)

## 2018-04-15 LAB — I-STAT ARTERIAL BLOOD GAS, ED
ACID-BASE DEFICIT: 9 mmol/L — AB (ref 0.0–2.0)
Bicarbonate: 16 mmol/L — ABNORMAL LOW (ref 20.0–28.0)
O2 Saturation: 100 %
TCO2: 17 mmol/L — ABNORMAL LOW (ref 22–32)
pCO2 arterial: 29.8 mmHg — ABNORMAL LOW (ref 32.0–48.0)
pH, Arterial: 7.334 — ABNORMAL LOW (ref 7.350–7.450)
pO2, Arterial: 272 mmHg — ABNORMAL HIGH (ref 83.0–108.0)

## 2018-04-15 LAB — CBC
HCT: 33.5 % — ABNORMAL LOW (ref 36.0–46.0)
Hemoglobin: 10.2 g/dL — ABNORMAL LOW (ref 12.0–15.0)
MCH: 24.5 pg — ABNORMAL LOW (ref 26.0–34.0)
MCHC: 30.4 g/dL (ref 30.0–36.0)
MCV: 80.3 fL (ref 80.0–100.0)
Platelets: 249 10*3/uL (ref 150–400)
RBC: 4.17 MIL/uL (ref 3.87–5.11)
RDW: 14.9 % (ref 11.5–15.5)
WBC: 6.8 10*3/uL (ref 4.0–10.5)
nRBC: 0 % (ref 0.0–0.2)

## 2018-04-15 LAB — RESPIRATORY PANEL BY PCR

## 2018-04-15 LAB — BASIC METABOLIC PANEL
ANION GAP: 22 — AB (ref 5–15)
Anion gap: 21 — ABNORMAL HIGH (ref 5–15)
BUN: 30 mg/dL — ABNORMAL HIGH (ref 6–20)
BUN: 39 mg/dL — ABNORMAL HIGH (ref 6–20)
CALCIUM: 8.5 mg/dL — AB (ref 8.9–10.3)
CO2: 14 mmol/L — ABNORMAL LOW (ref 22–32)
CO2: 15 mmol/L — ABNORMAL LOW (ref 22–32)
Calcium: 7.2 mg/dL — ABNORMAL LOW (ref 8.9–10.3)
Chloride: 102 mmol/L (ref 98–111)
Chloride: 103 mmol/L (ref 98–111)
Creatinine, Ser: 4.88 mg/dL — ABNORMAL HIGH (ref 0.44–1.00)
Creatinine, Ser: 4.67 mg/dL — ABNORMAL HIGH (ref 0.44–1.00)
GFR calc Af Amer: 13 mL/min — ABNORMAL LOW (ref >60)
GFR, EST AFRICAN AMERICAN: 13 mL/min — AB (ref >60)
GFR, EST NON AFRICAN AMERICAN: 11 mL/min — AB (ref >60)
GFR, EST NON AFRICAN AMERICAN: 11 mL/min — AB (ref >60)
Glucose, Bld: 121 mg/dL — ABNORMAL HIGH (ref 70–99)
Glucose, Bld: 100 mg/dL — ABNORMAL HIGH (ref 70–99)
Potassium: 3.7 mmol/L (ref 3.5–5.1)
Potassium: 4.1 mmol/L (ref 3.5–5.1)
SODIUM: 138 mmol/L (ref 135–145)
Sodium: 139 mmol/L (ref 135–145)

## 2018-04-15 LAB — URINALYSIS, ROUTINE W REFLEX MICROSCOPIC
BILIRUBIN URINE: NEGATIVE
Glucose, UA: NEGATIVE mg/dL
Ketones, ur: 15 mg/dL — AB
Leukocytes, UA: NEGATIVE
Nitrite: NEGATIVE
Protein, ur: 100 mg/dL — AB
Specific Gravity, Urine: >1.03 — ABNORMAL HIGH (ref 1.005–1.030)
pH: 5 (ref 5.0–8.0)

## 2018-04-15 LAB — URINALYSIS, MICROSCOPIC (REFLEX)

## 2018-04-15 LAB — I-STAT CG4 LACTIC ACID, ED
LACTIC ACID, VENOUS: 6.58 mmol/L — AB (ref 0.5–1.9)
Lactic Acid, Venous: 4.44 mmol/L (ref 0.5–1.9)

## 2018-04-15 LAB — LACTIC ACID, PLASMA: Lactic Acid, Venous: 4 mmol/L (ref 0.5–1.9)

## 2018-04-15 LAB — HEPATIC FUNCTION PANEL
ALT: 42 U/L (ref 0–44)
AST: 49 U/L — ABNORMAL HIGH (ref 15–41)
Albumin: 3.1 g/dL — ABNORMAL LOW (ref 3.5–5.0)
Alkaline Phosphatase: 67 U/L (ref 38–126)
Bilirubin, Direct: 0.6 mg/dL — ABNORMAL HIGH (ref 0.0–0.2)
Indirect Bilirubin: 0.7 mg/dL (ref 0.3–0.9)
Total Bilirubin: 1.3 mg/dL — ABNORMAL HIGH (ref 0.3–1.2)
Total Protein: 7.6 g/dL (ref 6.5–8.1)

## 2018-04-15 LAB — STREP PNEUMONIAE URINARY ANTIGEN: Strep Pneumo Urinary Antigen: POSITIVE — AB

## 2018-04-15 LAB — CREATININE, SERUM
Creatinine, Ser: 4.78 mg/dL — ABNORMAL HIGH (ref 0.44–1.00)
GFR calc Af Amer: 13 mL/min — ABNORMAL LOW (ref >60)
GFR, EST NON AFRICAN AMERICAN: 11 mL/min — AB (ref >60)

## 2018-04-15 LAB — MRSA PCR SCREENING: MRSA by PCR: NEGATIVE

## 2018-04-15 LAB — CG4 I-STAT (LACTIC ACID): Lactic Acid, Venous: 3.79 mmol/L (ref 0.5–1.9)

## 2018-04-15 LAB — BRAIN NATRIURETIC PEPTIDE: B Natriuretic Peptide: 1013.5 pg/mL — ABNORMAL HIGH (ref 0.0–100.0)

## 2018-04-15 MED ORDER — SODIUM CHLORIDE 0.9 % IV SOLN
INTRAVENOUS | Status: AC
  Administered 2018-04-15 – 2018-04-16 (×2): via INTRAVENOUS

## 2018-04-15 MED ORDER — ALBUTEROL SULFATE (2.5 MG/3ML) 0.083% IN NEBU: 5.0000 mg | INHALATION_SOLUTION | Freq: Once | RESPIRATORY_TRACT | Status: DC

## 2018-04-15 MED ORDER — SODIUM CHLORIDE 0.9 % IV BOLUS
500.0000 mL | Freq: Once | INTRAVENOUS | Status: AC
  Administered 2018-04-15: 500 mL via INTRAVENOUS

## 2018-04-15 MED ORDER — SODIUM CHLORIDE 0.9 % IV SOLN
500.0000 mg | INTRAVENOUS | Status: DC
  Administered 2018-04-15 – 2018-04-16 (×2): 500 mg via INTRAVENOUS
  Filled 2018-04-15 (×2): qty 500

## 2018-04-15 MED ORDER — OSELTAMIVIR PHOSPHATE 75 MG PO CAPS: 75.0000 mg | ORAL_CAPSULE | Freq: Once | ORAL | Status: DC

## 2018-04-15 MED ORDER — SODIUM CHLORIDE 0.9 % IV BOLUS (SEPSIS)
800.0000 mL | Freq: Once | INTRAVENOUS | Status: AC
  Administered 2018-04-15: 800 mL via INTRAVENOUS

## 2018-04-15 MED ORDER — ENOXAPARIN SODIUM 30 MG/0.3ML ~~LOC~~ SOLN
30.0000 mg | Freq: Every day | SUBCUTANEOUS | Status: DC
  Administered 2018-04-15 – 2018-04-18 (×4): 30 mg via SUBCUTANEOUS
  Filled 2018-04-15 (×4): qty 0.3

## 2018-04-15 MED ORDER — LORAZEPAM 2 MG/ML IJ SOLN
1.0000 mg | Freq: Once | INTRAMUSCULAR | Status: AC
  Administered 2018-04-15: 1 mg via INTRAVENOUS
  Filled 2018-04-15: qty 1

## 2018-04-15 MED ORDER — SODIUM CHLORIDE 0.9 % IV SOLN
INTRAVENOUS | Status: DC
  Administered 2018-04-16 – 2018-04-19 (×5): via INTRAVENOUS

## 2018-04-15 MED ORDER — SODIUM CHLORIDE 0.9 % IV BOLUS (SEPSIS)
1000.0000 mL | Freq: Once | INTRAVENOUS | Status: AC
  Administered 2018-04-15: 1000 mL via INTRAVENOUS

## 2018-04-15 MED ORDER — FENTANYL CITRATE (PF) 100 MCG/2ML IJ SOLN
12.5000 ug | INTRAMUSCULAR | Status: DC | PRN
  Administered 2018-04-15: 25 ug via INTRAVENOUS
  Administered 2018-04-16: 12.5 ug via INTRAVENOUS
  Administered 2018-04-16 – 2018-04-23 (×23): 25 ug via INTRAVENOUS
  Filled 2018-04-15 (×25): qty 2

## 2018-04-15 MED ORDER — AZITHROMYCIN 500 MG IV SOLR
INTRAVENOUS | Status: AC
  Administered 2018-04-15: 500 mg
  Filled 2018-04-15: qty 500

## 2018-04-15 MED ORDER — SODIUM CHLORIDE 0.9 % IV SOLN
2.0000 g | INTRAVENOUS | Status: DC
  Administered 2018-04-15 – 2018-04-20 (×6): 2 g via INTRAVENOUS
  Filled 2018-04-15 (×7): qty 20

## 2018-04-15 MED ORDER — FENTANYL CITRATE (PF) 100 MCG/2ML IJ SOLN
12.5000 ug | INTRAMUSCULAR | Status: DC | PRN
  Administered 2018-04-15 (×2): 12.5 ug via INTRAVENOUS
  Filled 2018-04-15 (×2): qty 2

## 2018-04-15 MED ORDER — SODIUM CHLORIDE 0.9 % IV SOLN
INTRAVENOUS | Status: AC
  Administered 2018-04-15 (×2): via INTRAVENOUS

## 2018-04-15 NOTE — ED Provider Notes (Addendum)
[facility] EMERGENCY DEPARTMENT Provider Note   CSN: [id number] Arrival date & time: 04/15/18  4098     History   Chief Complaint Chief Complaint  Patient presents with  . Shortness of Breath    HPI Hannah Harvey is a 32 y.o. female.  HPI  This is a 32 year old female with a history of hypertension who presents with shortness of breath.  Patient reports 2-day history of worsening shortness of breath.  She reports productive cough and chills.  No documented fevers at home.  No known sick contacts.  Patient denies history of asthma or smoking.  Denies vaping.  Denies any lower extremity swelling or history of heart failure.  Does report some shortness of breath with lying flat.  Patient has never had anything like this before.  She denies chest pain, congestion.  She reports a "mild sore throat."  Patient takes amlodipine and losartan for hypertension.  Reports compliance.  Level 5 caveat for acuity of condition  Past Medical History:  Diagnosis Date  . Hypertension     Patient Active Problem List   Diagnosis Date Noted  . Breast abscess, right 10/17/2012    Past Surgical History:  Procedure Laterality Date  . CESAREAN SECTION    . CHOLECYSTECTOMY       OB History   None      Home Medications    Prior to Admission medications   Medication Sig Start Date End Date Taking? Authorizing Provider  albuterol (PROVENTIL HFA;VENTOLIN HFA) 108 (90 Base) MCG/ACT inhaler Inhale 1-2 puffs into the lungs every 6 (six) hours as needed for wheezing or shortness of breath. 10/31/17   Arby Barrette, MD  amLODipine (NORVASC) 10 MG tablet Take 10 mg by mouth daily.    [provider]  doxycycline (VIBRAMYCIN) 100 MG capsule Take 1 capsule (100 mg total) by mouth 2 (two) times daily. 12/06/17   Caccavale, Sophia, PA-C  fluticasone (FLONASE) 50 MCG/ACT nasal spray Place 1 spray into both nostrils daily. 10/31/17   Arby Barrette, MD  loratadine (CLARITIN) 10  MG tablet Take 1 tablet (10 mg total) by mouth daily. 10/31/17   Arby Barrette, MD  losartan (COZAAR) 50 MG tablet Take 50 mg by mouth daily.    [provider]  nitrofurantoin, macrocrystal-monohydrate, (MACROBID) 100 MG capsule Take 1 capsule (100 mg total) by mouth 2 (two) times daily. 03/04/18   Rise Mu, PA-C  phenazopyridine (PYRIDIUM) 200 MG tablet Take 1 tablet (200 mg total) by mouth 3 (three) times daily. 03/04/18   Rise Mu, PA-C  Spacer/Aero-Holding Chambers (AEROCHAMBER PLUS WITH MASK) inhaler Use as instructed 10/31/17   Arby Barrette, MD    Family History Family History  Problem Relation Age of Onset  . Hyperlipidemia Mother   . Heart disease Mother   . Hyperlipidemia Father     Social History Social History   Tobacco Use  . Smoking status: Current Every Day Smoker    Packs/day: 0.25  . Smokeless tobacco: Never Used  Substance Use Topics  . Alcohol use: Yes    Comment: everyday  . Drug use: No     Allergies   Patient has no known allergies.   Review of Systems Review of Systems  Constitutional: Positive for chills. Negative for fever.  HENT: Positive for sore throat. Negative for congestion.   Respiratory: Positive for cough and shortness of breath.   Cardiovascular: Negative for chest pain and leg swelling.  Gastrointestinal: Negative for abdominal pain, nausea and  vomiting.  Genitourinary: Negative for dysuria.  All other systems reviewed and are negative.    Physical Exam Updated Vital Signs BP (!) 91/37   Pulse (!) 133   Temp (!) 97.5 F (36.4 C) (Oral)   Resp (!) 36   Ht 1.651 m (5\' 5" )   Wt (!) 140.6 kg   LMP 04/01/2018 (Exact Date)   SpO2 100%   BMI 51.59 kg/m   Physical Exam  Constitutional: She is oriented to person, place, and time. She appears well-developed and well-nourished. She appears ill.  Obese, increased work of breathing  HENT:  Head: Normocephalic and atraumatic.  Eyes: Pupils are  equal, round, and reactive to light.  Neck: Neck supple.  Cardiovascular: Regular rhythm and normal heart sounds.  Tachycardia  Pulmonary/Chest: Tachypnea noted. She is in respiratory distress. She has no wheezes. She has rales in the right upper field and the right lower field.  Increased work of breathing, speaking in short sentences, Rales most prominently right upper and lower lobes  Abdominal: Soft. Bowel sounds are normal. There is no tenderness.  Musculoskeletal:       Right lower leg: She exhibits no edema.       Left lower leg: She exhibits no edema.  Neurological: She is alert and oriented to person, place, and time.  Skin: Skin is warm and dry.  Psychiatric: She has a normal mood and affect.  Nursing note and vitals reviewed.    ED Treatments / Results  Labs (all labs ordered are listed, but only abnormal results are displayed) Labs Reviewed  CBC WITH DIFFERENTIAL/PLATELET - Abnormal; Notable for the following components:      Result Value   Hemoglobin 11.5 (*)    MCH 24.5 (*)    All other components within normal limits  BASIC METABOLIC PANEL - Abnormal; Notable for the following components:   CO2 14 (*)    Glucose, Bld 121 (*)    BUN 30 (*)    Creatinine, Ser 4.88 (*)    Calcium 8.5 (*)    GFR calc non Af Amer 11 (*)    GFR calc Af Amer 13 (*)    Anion gap 22 (*)    All other components within normal limits  BRAIN NATRIURETIC PEPTIDE - Abnormal; Notable for the following components:   B Natriuretic Peptide 1,013.5 (*)    All other components within normal limits  I-STAT ARTERIAL BLOOD GAS, ED - Abnormal; Notable for the following components:   pH, Arterial 7.334 (*)    pCO2 arterial 29.8 (*)    pO2, Arterial 272.0 (*)    Bicarbonate 16.0 (*)    TCO2 17 (*)    Acid-base deficit 9.0 (*)    All other components within normal limits  I-STAT CG4 LACTIC ACID, ED - Abnormal; Notable for the following components:   Lactic Acid, Venous 6.58 (*)    All other  components within normal limits  CULTURE, BLOOD (ROUTINE X 2)  CULTURE, BLOOD (ROUTINE X 2)  INFLUENZA PANEL BY PCR (TYPE A & B)  URINALYSIS, ROUTINE W REFLEX MICROSCOPIC  HEPATIC FUNCTION PANEL    EKG EKG Interpretation  Date/Time:  Monday April 15 2018 05:30:39 EST Ventricular Rate:  134 PR Interval:    QRS Duration: 93 QT Interval:  291 QTC Calculation: 435 R Axis:   -36 Text Interpretation:  Sinus tachycardia Inferior infarct, old Anterior infarct, old Lateral leads are also involved No prior for comparison Confirmed by Ross MarcusHorton, Dim Meisinger ([id number]) on 04/15/2018  5:45:45 AM   Radiology Dg Chest Portable 1 View  Result Date: 04/15/2018 CLINICAL DATA:  Initial evaluation for acute shortness of breath. EXAM: PORTABLE CHEST 1 VIEW COMPARISON:  None. FINDINGS: Mild cardiomegaly. Mediastinal silhouette within normal limits. Lungs hypoinflated. Dense consolidative opacity present within the right upper lobe, concerning for pneumonia. Associated air bronchograms. DISH in ule patchy left perihilar infiltrate also concerning for infection. Underlying perihilar vascular congestion without overt pulmonary edema. No pleural effusion. No pneumothorax. No acute osseous abnormality. IMPRESSION: Multifocal infiltrates involving the right upper lobe and left perihilar region, concerning for multifocal pneumonia. Electronically Signed   By: Rise Mu M.D.   On: 04/15/2018 05:56    Procedures Procedures (including critical care time)    EMERGENCY DEPARTMENT [place] CARDIAC EXAM "Study: Limited Ultrasound of the Heart and Pericardium"  INDICATIONS:Dyspnea Multiple views of the heart and pericardium were obtained in real-time with a multi-frequency probe.  PERFORMED MV:HQIONG IMAGES ARCHIVED?: Yes LIMITATIONS:  Body habitus and Emergent procedure VIEWS USED: Parasternal short axis INTERPRETATION: Cardiac activity present, Pericardial effusioin absent, Cardiac tamponade absent and Normal  contractility  Hyperdynamic, no obvious oral effusion or tamponade, low normal to normal EF but limited by body habitus and tachycardia  CRITICAL CARE Performed by: Shon Baton   Total critical care time: 75 minutes  Critical care time was exclusive of separately billable procedures and treating other patients.  Critical care was necessary to treat or prevent imminent or life-threatening deterioration.  Critical care was time spent personally by me on the following activities: development of treatment plan with patient and/or surrogate as well as nursing, discussions with consultants, evaluation of patient's response to treatment, examination of patient, obtaining history from patient or surrogate, ordering and performing treatments and interventions, ordering and review of laboratory studies, ordering and review of radiographic studies, pulse oximetry and re-evaluation of patient's condition.   Medications Ordered in ED Medications  cefTRIAXone (ROCEPHIN) 2 g in sodium chloride 0.9 % 100 mL IVPB (2 g Intravenous New Bag/Given 04/15/18 0635)  azithromycin (ZITHROMAX) 500 mg in sodium chloride 0.9 % 250 mL IVPB (500 mg Intravenous New Bag/Given 04/15/18 0618)  sodium chloride 0.9 % bolus 1,000 mL (1,000 mLs Intravenous New Bag/Given 04/15/18 2952)    And  sodium chloride 0.9 % bolus 800 mL (800 mLs Intravenous New Bag/Given 04/15/18 8413)  oseltamivir (TAMIFLU) capsule 75 mg (0 mg Oral Hold 04/15/18 0640)  azithromycin (ZITHROMAX) 500 MG injection (500 mg  Given 04/15/18 0618)  LORazepam (ATIVAN) injection 1 mg (1 mg Intravenous Given 04/15/18 2440)     Initial Impression / Assessment and Plan / ED Course  I have reviewed the triage vital signs and the nursing notes.  Pertinent labs & imaging results that were available during my care of the patient were reviewed by me and considered in my medical decision making (see chart for details).  Clinical Course as of Apr 15 656  Mon Apr 15, 2018  1027 X-ray concerning for multifocal pneumonia.  Patient was given Rocephin and azithromycin.  Blood cultures obtained.  Influenza screen also obtained.  Lactate is 6.  She is not hypotensive.  I suspect at least part of her lactate is related to her work of breathing.  However, 30 cc/kg for her ideal weight was ordered.   [CH]  [phone] Patient reports that she does not feel any better with BiPAP in place.  She appears anxious but her work of breathing is improved.  Patient was given Ativan in an  attempt to have her tolerate BiPAP.  I discussed with her that if she did not tolerate BiPAP, she may require intubation.   [CH]  0622 On reassessment, patient's repeat blood pressure 70 systolic.  Fluids have been initiated.  We will recheck for fluid responsiveness.  Of note, creatinine noted to be 4.88.  No known history of renal disease.  She reports that she was treated for UTI 1 month ago.  She reports continued urine output at this time.   [CH]  0640 Repeat blood pressure 91/37.  ABG pH 7.334, PCO2 29, PO2 272.  She still is not appropriately oxygenating as she is on 100%.  We will titrate for ARDS to increase PEEP and attempt to titrate down her O2 requirements.   [CH]  [id number] Discussed with Dr. Warrick Parisian.  He has accepted to the ICU.  Bedside ultrasound does not show any large pericardial effusion.  Hyperkinetic squeeze but appears to have appropriate squeeze.  This was limited secondary to body habitus.   [CH]    Clinical Course User Index [CH] Nathanyel Defenbaugh, Mayer Masker, MD    Patient presents with shortness of breath.  On my initial evaluation she has significant increased work of breathing, hypoxia into the mid 80s, respiratory rate high 30s.  Initially she is afebrile.  Blood pressure 109 systolic.  Given pulmonary exam, with cough and chills, high concern for infection.  Pulmonary edema is also consideration.  Myocarditis is also a consideration.  Patient was immediately placed on BiPAP for work of  breathing and hypoxia.  However, no notable risk factors for cardiomyopathy or failure.  Basic lab work obtained.  Chest x-ray obtained.  Chest x-ray is concerning for multifocal pneumonia.  Sepsis work-up initiated.  Suspect her lactate will be elevated given respiratory distress.  Blood cultures obtained.  Patient was given Rocephin, azithromycin, Tamiflu.  Patient is morbidly obese.  30 cc/kg for ideal body weight is 1800 cc.  Will fluid resuscitate and reassess.  See clinical course above.  On multiple rechecks, patient's respiratory status is tenuous.  Blood pressure does appear responsive to fluids but tachycardia remains.  Critical care intensivist consulted as I believe patient will require ICU services.  Patient will be signed out to oncoming provider for reassessment.  She was accepted to the ICU and is awaiting a bed.    7:06 AM Bedside rounds with oncoming doctor, Dr.Curatolo.  Patient states she feels somewhat better on BiPAP.  She has received 1000cc repeat blood pressure 89/58.  She continues to [facility] well.  Antibiotics are infusing.  Final Clinical Impressions(s) / ED Diagnoses   Final diagnoses:  Septic shock (HCC)  Multifocal pneumonia  AKI (acute kidney injury) (HCC)  Acute respiratory failure with hypoxia [place] [facility])    ED Discharge Orders    None       Allyiah Gartner, Mayer Masker, MD 04/15/18 1610    Shon Baton, MD 04/15/18 [phone]

## 2018-04-15 NOTE — ED Notes (Signed)
[facility] here to transfer patient.  Patient is on BiPap.

## 2018-04-15 NOTE — Progress Notes (Signed)
[facility] Physician-Brief Progress Note Patient Name: Hannah RanchDominique Jones-Pullman DOB: 08/09/1985 MRN: [id number]   Date of Service  04/15/2018  HPI/Events of Note  Patient c/o back pain related to cough.   eICU Interventions  Will order: 1. Increase Fentanyl dose to 12.5 25 mcg IV Q 2 hours PRN.      Intervention Category Intermediate Interventions: Pain - evaluation and management  Sommer,Steven Eugene 04/15/2018, 10:35 PM

## 2018-04-15 NOTE — H&P (Signed)
NAME:  Kendrick RanchDominique Jones-Buttler, MRN:  [id number], DOB:  06/06/1985, LOS: 0 ADMISSION DATE:  04/15/2018, CONSULTATION DATE:  04/15/18 REFERRING MD:  Wilkie AyeHorton, MCHP, CHIEF COMPLAINT:  Respiratory insufficiency   HPI/course in hospital  32 year old generally healthy woman with 48h history of NP cough and severe dyspnea starting around 0200 on 12/2.  Placed on BiPAP and started on antibiotics for suspected CAP and [facility]. Found to have AKI and lactic acidosis.  Past Medical History  She,  has a past medical history of Hypertension.  Past Surgical History:  Procedure Laterality Date  . CESAREAN SECTION    . CHOLECYSTECTOMY       Review of Systems:   Review of Systems  Constitutional: Positive for malaise/fatigue.  HENT: Negative for congestion, sinus pain and sore throat.   Eyes: Negative.   Respiratory: Positive for cough and shortness of breath. Negative for sputum production.   Cardiovascular: Negative for chest pain and leg swelling.  Gastrointestinal: Positive for diarrhea (starting this morning).  Genitourinary: Negative.   Musculoskeletal: Negative.   Skin: Negative.   Neurological: Negative.   Endo/Heme/Allergies: Negative.   Psychiatric/Behavioral: Negative.     Social History   reports that she has been smoking. She has been smoking about 0.25 packs per day. She has never used smokeless tobacco. She reports that she drinks alcohol. She reports that she does not use drugs.   Family History   Her family history includes Heart disease in her mother; Hyperlipidemia in her father and mother.   Allergies No Known Allergies   Home Medications  Prior to Admission medications   Medication Sig Start Date End Date Taking? Authorizing Provider  amLODipine (NORVASC) 10 MG tablet Take 10 mg by mouth daily.   Yes [provider]  ibuprofen (ADVIL,MOTRIN) 200 MG tablet Take 200 mg by mouth every 6 (six) hours as needed for headache or mild pain.   Yes [provider]  losartan (COZAAR) 100 MG tablet Take 100 mg by mouth every morning. 02/11/18  Yes [provider]  Vitamin D, Ergocalciferol, (DRISDOL) 1.25 MG (50000 UT) CAPS capsule Take 50,000 Units by mouth every Monday. 03/14/18  Yes [provider]  albuterol (PROVENTIL HFA;VENTOLIN HFA) 108 (90 Base) MCG/ACT inhaler Inhale 1-2 puffs into the lungs every 6 (six) hours as needed for wheezing or shortness of breath. Patient not taking: Reported on 04/15/2018 10/31/17   Arby BarrettePfeiffer, Marcy, MD  fluticasone [facility](FLONASE) 50 MCG/ACT nasal spray Place 1 spray into both nostrils daily. Patient not taking: Reported on 04/15/2018 10/31/17   Arby BarrettePfeiffer, Marcy, MD  loratadine (CLARITIN) 10 MG tablet Take 1 tablet (10 mg total) by mouth daily. Patient not taking: Reported on 04/15/2018 10/31/17   Arby BarrettePfeiffer, Marcy, MD  Spacer/Aero-Holding Chambers (AEROCHAMBER PLUS WITH MASK) inhaler Use as instructed Patient not taking: Reported on 04/15/2018 10/31/17   Arby BarrettePfeiffer, Marcy, MD     Interim history/subjective:  Minimal relief of dyspnea with BiPAP  Objective   Blood pressure 93/72, pulse (!) 130, temperature 98.3 F (36.8 C), temperature source Axillary, resp. rate (!) 31, height 5\' 5"  (1.651 m), weight (!) 140.6 kg, last menstrual period 04/01/2018, SpO2 98 %.    FiO2 (%):  [60 %-80 %] 60 %   Intake/Output Summary (Last 24 hours) at 04/15/2018 1247 Last data filed at 04/15/2018 0735 Gross per 24 hour  Intake 2050 ml  Output -  Net 2050 ml   Filed Weights   04/15/18 0525 04/15/18 0529  Weight: Marland Kitchen(!)  140.6 kg (!) 140.6 kg    Examination: Physical Exam  Constitutional: She is oriented to person, place, and time. She appears well-developed. She appears distressed.  Obese  HENT:  Head: Normocephalic.  Mouth/Throat: No oropharyngeal exudate.  Eyes: Conjunctivae are normal.  Neck: No JVD present.  + sternocleidomastoid use.  Cardiovascular: Regular rhythm and normal pulses. Tachycardia present.  Exam reveals distant heart sounds.  Respiratory: Accessory muscle usage present. No stridor. Tachypnea noted. She is in respiratory distress. She has decreased breath sounds (bronchial breath sounds RUF) in the right upper field. She has no wheezes. She has no rhonchi. She has no rales.  GI: Soft. Normal appearance. There is no tenderness.  Neurological: She is alert and oriented to person, place, and time. GCS eye subscore is 4. GCS verbal subscore is 5. GCS motor subscore is 6.  Skin: Skin is warm, dry and intact. No cyanosis. Nails show no clubbing.     Ancillary tests (personally reviewed)  CBC: Recent Labs  Lab 04/15/18 0543  WBC 6.6  NEUTROABS 5.4  HGB 11.5*  HCT 37.9  MCV 80.8  PLT 293    Basic Metabolic Panel: Recent Labs  Lab 04/15/18 0543  NA 138  K 3.7  CL 102  CO2 14*  GLUCOSE 121*  BUN 30*  CREATININE 4.88*  CALCIUM 8.5*   GFR: Estimated Creatinine Clearance: 23.2 mL/min (A) (by C-G formula based on SCr of 4.88 mg/dL (H)). Recent Labs  Lab 04/15/18 0543 04/15/18 0611 04/15/18 0817  WBC 6.6  --   --   LATICACIDVEN  --  6.58* 4.44*    Liver Function Tests: Recent Labs  Lab 04/15/18 0543  AST 49*  ALT 42  ALKPHOS 67  BILITOT 1.3*  PROT 7.6  ALBUMIN 3.1*   No results for input(s): LIPASE, AMYLASE in the last 168 hours. No results for input(s): AMMONIA in the last 168 hours.  ABG    Component Value Date/Time   PHART 7.334 (L) 04/15/2018 0637   PCO2ART 29.8 (L) 04/15/2018 0637   PO2ART 272.0 (H) 04/15/2018 0637   HCO3 16.0 (L) 04/15/2018 0637   TCO2 17 (L) 04/15/2018 0637   ACIDBASEDEF 9.0 (H) 04/15/2018 0637   O2SAT 100.0 04/15/2018 0637     Coagulation Profile: No results for input(s): INR, PROTIME in the last 168 hours.  Cardiac Enzymes: No results for input(s): CKTOTAL, CKMB, CKMBINDEX, TROPONINI in the last 168 hours.  HbA1C: No results found for: HGBA1C  CBG: No results for input(s): GLUCAP in the last 168  hours.   Assessment & Plan:  Critically ill due to acute hypoxic respiratory failure due to community acquired pneumonia. Requiring NIV support.  Given consolidation and diarrhea, pneumococcus likely.  Early sepsis with evidence of tissue hypoperfusion with elevated lactate AKI  Elevated BNP - possible component of HF, or RV dysfunction from hypoxia or renal failure induced volume overload.   PLAN  Continue NIV - ABG to reassess.  If no hypercapnea HFNC may be more appropriate; if signs of fatigue may require intubation.  Continue judicious fluid resuscitation to target MAP>65 and lactate clearance. (3ml/kg = 4200 ml) - will combine bolus and infi Urinalysis, renal ultrasound Foley to accurately measure urine output Echocardiogram  Follow respiratory cultures, continue current antibiotics, urine Ag studies.  Best practice:  Diet: NPO Pain/Anxiety/Delirium protocol (if indicated): low dose fentanyl to improve BiPAP compliance  VAP protocol (if indicated): n/a DVT prophylaxis: lovenox GI prophylaxis: n/a Glucose control: CBG and SSI Mobility: bedrest. Code  Status: full code.  Family Communication: fiance updated at bedside Disposition: ICU   Critical care time:     Lynnell Catalan, MD [facility] ICU Physician [facility]  Pager: [phone] Mobile: [phone] After hours: [phone].

## 2018-04-15 NOTE — ED Triage Notes (Signed)
Pt reports SOB x2 days with difficulty laying flat. Pt denies any respiratory history.

## 2018-04-15 NOTE — ED Provider Notes (Signed)
I assumed care of this patient from Dr. Wilkie AyeHorton at 7 am.  Please see their note for further details of Hx, PE.  Briefly patient is a 32 y.o. female who presented with SOB.   Current plan is to admit to ICU, patient on BiPap, getting fluids and antibiotics, multifocal pna vs volume or combination of both. Intially hypotensive but BP improved following fluids. Mentation good on BiPaP, normal pH. Lactic 6.58. Awaiting transport to [facility] at [facility].  [facility]  Blood pressure with map over 65 throughout my care.  Persistent tachycardia.  Given maintenance fluids prior to transfer.  Appears comfortable on BiPAP.  Normal mentation.  Lactic acid has improved.      Virgina NorfolkCuratolo, Patriece Archbold, DO 04/15/18 1005

## 2018-04-15 NOTE — ED Notes (Signed)
Pt placed on BiPap

## 2018-04-16 ENCOUNTER — Inpatient Hospital Stay (HOSPITAL_COMMUNITY): Payer: 59

## 2018-04-16 DIAGNOSIS — J9601 Acute respiratory failure with hypoxia: Secondary | ICD-10-CM

## 2018-04-16 LAB — POCT I-STAT 3, ART BLOOD GAS (G3+)
Acid-base deficit: 10 mmol/L — ABNORMAL HIGH (ref 0.0–2.0)
Bicarbonate: 15.6 mmol/L — ABNORMAL LOW (ref 20.0–28.0)
O2 Saturation: 97 %
Patient temperature: 97.8
TCO2: 17 mmol/L — ABNORMAL LOW (ref 22–32)
pCO2 arterial: 33.9 mmHg (ref 32.0–48.0)
pH, Arterial: 7.269 — ABNORMAL LOW (ref 7.350–7.450)
pO2, Arterial: 96 mmHg (ref 83.0–108.0)

## 2018-04-16 LAB — CBC WITH DIFFERENTIAL/PLATELET
BASOS PCT: 0 %
Band Neutrophils: 3 %
Basophils Absolute: 0 10*3/uL (ref 0.0–0.1)
Blasts: 0 %
Eosinophils Absolute: 0.1 10*3/uL (ref 0.0–0.5)
Eosinophils Relative: 1 %
HCT: 29 % — ABNORMAL LOW (ref 36.0–46.0)
Hemoglobin: 8.8 g/dL — ABNORMAL LOW (ref 12.0–15.0)
Lymphocytes Relative: 21 %
Lymphs Abs: 1.9 10*3/uL (ref 0.7–4.0)
MCH: 24.1 pg — ABNORMAL LOW (ref 26.0–34.0)
MCHC: 30.3 g/dL (ref 30.0–36.0)
MCV: 79.5 fL — ABNORMAL LOW (ref 80.0–100.0)
MONO ABS: 0.8 10*3/uL (ref 0.1–1.0)
Metamyelocytes Relative: 2 %
Monocytes Relative: 9 %
Myelocytes: 1 %
Neutro Abs: 6.3 10*3/uL (ref 1.7–7.7)
Neutrophils Relative %: 63 %
Other: 0 %
PROMYELOCYTES RELATIVE: 0 %
Platelets: 216 10*3/uL (ref 150–400)
RBC: 3.65 MIL/uL — ABNORMAL LOW (ref 3.87–5.11)
RDW: 14.5 % (ref 11.5–15.5)
WBC: 9.1 10*3/uL (ref 4.0–10.5)
nRBC: 0 % (ref 0.0–0.2)
nRBC: 0 /100 WBC

## 2018-04-16 LAB — BASIC METABOLIC PANEL
Anion gap: 17 — ABNORMAL HIGH (ref 5–15)
BUN: 45 mg/dL — ABNORMAL HIGH (ref 6–20)
CHLORIDE: 103 mmol/L (ref 98–111)
CO2: 15 mmol/L — ABNORMAL LOW (ref 22–32)
CREATININE: 3.65 mg/dL — AB (ref 0.44–1.00)
Calcium: 6.5 mg/dL — ABNORMAL LOW (ref 8.9–10.3)
GFR calc Af Amer: 18 mL/min — ABNORMAL LOW (ref >60)
GFR calc non Af Amer: 15 mL/min — ABNORMAL LOW (ref >60)
Glucose, Bld: 103 mg/dL — ABNORMAL HIGH (ref 70–99)
Potassium: 3.7 mmol/L (ref 3.5–5.1)
Sodium: 135 mmol/L (ref 135–145)

## 2018-04-16 LAB — ECHOCARDIOGRAM COMPLETE
HEIGHTINCHES: 65 in
Weight: 5185.22 oz

## 2018-04-16 LAB — LACTIC ACID, PLASMA: Lactic Acid, Venous: 1.7 mmol/L (ref 0.5–1.9)

## 2018-04-16 LAB — LEGIONELLA PNEUMOPHILA SEROGP 1 UR AG: L. PNEUMOPHILA SEROGP 1 UR AG: NEGATIVE

## 2018-04-16 MED ORDER — GUAIFENESIN-DM 100-10 MG/5ML PO SYRP
10.0000 mL | ORAL_SOLUTION | ORAL | Status: DC | PRN
  Administered 2018-04-16 – 2018-04-22 (×22): 10 mL via ORAL
  Filled 2018-04-16 (×23): qty 10

## 2018-04-16 MED ORDER — ORAL CARE MOUTH RINSE
15.0000 mL | Freq: Two times a day (BID) | OROMUCOSAL | Status: DC
  Administered 2018-04-16: 15 mL via OROMUCOSAL

## 2018-04-16 MED ORDER — ACETAMINOPHEN 500 MG PO TABS
1000.0000 mg | ORAL_TABLET | Freq: Once | ORAL | Status: AC
  Administered 2018-04-16: 1000 mg via ORAL
  Filled 2018-04-16: qty 2

## 2018-04-16 MED ORDER — COLCHICINE 0.6 MG PO TABS
0.6000 mg | ORAL_TABLET | Freq: Two times a day (BID) | ORAL | Status: AC
  Administered 2018-04-16 – 2018-04-17 (×3): 0.6 mg via ORAL
  Filled 2018-04-16 (×3): qty 1

## 2018-04-16 MED ORDER — ACETAMINOPHEN 325 MG PO TABS
650.0000 mg | ORAL_TABLET | ORAL | Status: DC | PRN
  Administered 2018-04-16 – 2018-04-23 (×15): 650 mg via ORAL
  Filled 2018-04-16 (×15): qty 2

## 2018-04-16 NOTE — H&P (Signed)
NAME:  Hannah RanchDominique Harvey, MRN:  [id number], DOB:  10/13/1985, LOS: 1 ADMISSION DATE:  04/15/2018, CONSULTATION DATE:  04/15/18 REFERRING MD:  Wilkie AyeHorton, MCHP, CHIEF COMPLAINT:  Respiratory insufficiency   HPI/course in hospital  32 year old generally healthy woman with 48h history of NP cough and severe dyspnea starting around 0200 on 12/2.  Placed on BiPAP and started on antibiotics for suspected CAP and [facility]. Found to have AKI and lactic acidosis.  Past Medical History  She,  has a past medical history of Hypertension.  Past Surgical History:  Procedure Laterality Date  . CESAREAN SECTION    . CHOLECYSTECTOMY        Interim history/subjective:  Feels much better today but does complain of right sided pleuritic chest pain with deep breathing.   Objective   Blood pressure (!) 94/55, pulse (!) 130, temperature 97.8 F (36.6 C), temperature source Oral, resp. rate 20, height 5\' 5"  (1.651 m), weight (!) 147 kg, last menstrual period 04/01/2018, SpO2 100 %.    FiO2 (%):  [60 %] 60 %   Intake/Output Summary (Last 24 hours) at 04/16/2018 0901 Last data filed at 04/16/2018 0800 Gross per 24 hour  Intake 3510 ml  Output 1025 ml  Net 2485 ml   Filed Weights   04/15/18 0525 04/15/18 0529 04/15/18 1100  Weight: (!) 140.6 kg (!) 140.6 kg (!) 147 kg    Examination: Physical Exam  Constitutional: She is oriented to person, place, and time. She appears well-developed. No distress.  Obese  HENT:  Head: Normocephalic.  Mouth/Throat: No oropharyngeal exudate.  Eyes: Conjunctivae are normal.  Neck: No JVD present.  + sternocleidomastoid use.  Cardiovascular: Regular rhythm and normal pulses. Tachycardia present. Exam reveals distant heart sounds.  Respiratory: Accessory muscle usage present. No stridor. Tachypnea noted. No respiratory distress. She has no decreased breath sounds (bronchial breath sounds RUF). She has no wheezes. She has no rhonchi. She has no rales.    Bronchial breathing has improved  GI: Soft. Normal appearance. There is no tenderness.  Neurological: She is alert and oriented to person, place, and time. GCS eye subscore is 4. GCS verbal subscore is 5. GCS motor subscore is 6.  Skin: Skin is warm, dry and intact. No cyanosis. Nails show no clubbing.     Ancillary tests (personally reviewed)  CBC: Recent Labs  Lab 04/15/18 0543 04/15/18 1359 04/16/18 0727  WBC 6.6 6.8 PENDING  NEUTROABS 5.4  --  PENDING  HGB 11.5* 10.2* 8.8*  HCT 37.9 33.5* 29.0*  MCV 80.8 80.3 79.5*  PLT 293 249 216    Basic Metabolic Panel: Recent Labs  Lab 04/15/18 0543 04/15/18 1359 04/15/18 1902 04/16/18 0727  NA 138  --  139 135  K 3.7  --  4.1 3.7  CL 102  --  103 103  CO2 14*  --  15* 15*  GLUCOSE 121*  --  100* 103*  BUN 30*  --  39* 45*  CREATININE 4.88* 4.78* 4.67* 3.65*  CALCIUM 8.5*  --  7.2* 6.5*   GFR: Estimated Creatinine Clearance: 31.9 mL/min (A) (by C-G formula based on SCr of 3.65 mg/dL (H)). Recent Labs  Lab 04/15/18 0543  04/15/18 0817 04/15/18 1342 04/15/18 1359 04/15/18 1902 04/16/18 0727  WBC 6.6  --   --   --  6.8  --  PENDING  LATICACIDVEN  --    < > 4.44* 3.79*  --  4.0* 1.7   < > = values  in this interval not displayed.    Liver Function Tests: Recent Labs  Lab 04/15/18 0543  AST 49*  ALT 42  ALKPHOS 67  BILITOT 1.3*  PROT 7.6  ALBUMIN 3.1*   No results for input(s): LIPASE, AMYLASE in the last 168 hours. No results for input(s): AMMONIA in the last 168 hours.  ABG    Component Value Date/Time   PHART 7.269 (L) 04/16/2018 0838   PCO2ART 33.9 04/16/2018 0838   PO2ART 96.0 04/16/2018 0838   HCO3 15.6 (L) 04/16/2018 0838   TCO2 17 (L) 04/16/2018 0838   ACIDBASEDEF 10.0 (H) 04/16/2018 0838   O2SAT 97.0 04/16/2018 0838     Coagulation Profile: No results for input(s): INR, PROTIME in the last 168 hours.  Cardiac Enzymes: No results for input(s): CKTOTAL, CKMB, CKMBINDEX, TROPONINI in the  last 168 hours.  HbA1C: No results found for: HGBA1C  CBG: No results for input(s): GLUCAP in the last 168 hours.   Assessment & Plan:  Patient was critically ill due to acute hypoxic respiratory failure due to community acquired pneumonia.  Noninvasive ventilatory support has been discontinued. Given consolidation and diarrhea, pneumococcus likely.  Early sepsis with evidence of tissue hypoperfusion with elevated lactate AKI creatinine is improving Elevated BNP - possible component of HF, or RV dysfunction from hypoxia or renal failure induced volume overload.   PLAN Continue ceftriaxone for CAP, Stop Tamiflu and azithromycin as viral studies and atypicals negative. Continue to wean oxygen.  Progressive ambulation  Continue judicious fluid resuscitation with fluid infusion urinalysis, renal ultrasound Continue to follow renal function resolved to normal Foley to accurately measure urine output, will leave catheter in place until we see 2 consecutive days of decreasing creatinine. Echocardiogram   Best practice:  Diet: Progressive diet Pain/Anxiety/Delirium protocol (if indicated): Tylenol and colchicine - avoid NSAIDs due to renal function. VAP protocol (if indicated): n/a DVT prophylaxis: lovenox GI prophylaxis: n/a Glucose control: CBG and SSI Mobility: Progressive ambulation Code Status: full code.  Family Communication: fiance updated at bedside Disposition: Transfer to the floor on hospital medicine. Hospitalist service contacted, order reconciliation performed  35 minutes of care including greater than 50% of time in counseling and coordination of care.     Lynnell Catalan, MD [facility] ICU Physician [facility] [place] Critical Care  Pager: [phone] Mobile: [phone] After hours: [phone].

## 2018-04-16 NOTE — Progress Notes (Signed)
  Echocardiogram 2D Echocardiogram has been performed.  Hannah Harvey, Hannah Harvey 04/16/2018, 5:45 PM

## 2018-04-17 DIAGNOSIS — R6520 Severe sepsis without septic shock: Secondary | ICD-10-CM

## 2018-04-17 DIAGNOSIS — J9600 Acute respiratory failure, unspecified whether with hypoxia or hypercapnia: Secondary | ICD-10-CM | POA: Diagnosis present

## 2018-04-17 DIAGNOSIS — A419 Sepsis, unspecified organism: Secondary | ICD-10-CM | POA: Diagnosis present

## 2018-04-17 DIAGNOSIS — A400 Sepsis due to streptococcus, group A: Secondary | ICD-10-CM

## 2018-04-17 DIAGNOSIS — J189 Pneumonia, unspecified organism: Secondary | ICD-10-CM | POA: Diagnosis present

## 2018-04-17 DIAGNOSIS — N179 Acute kidney failure, unspecified: Secondary | ICD-10-CM | POA: Diagnosis present

## 2018-04-17 LAB — CBC WITH DIFFERENTIAL/PLATELET
Abs Immature Granulocytes: 0.95 10*3/uL — ABNORMAL HIGH (ref 0.00–0.07)
Basophils Absolute: 0 10*3/uL (ref 0.0–0.1)
Basophils Relative: 0 %
EOS ABS: 0.2 10*3/uL (ref 0.0–0.5)
Eosinophils Relative: 2 %
HCT: 27.2 % — ABNORMAL LOW (ref 36.0–46.0)
Hemoglobin: 8.5 g/dL — ABNORMAL LOW (ref 12.0–15.0)
Immature Granulocytes: 8 %
Lymphocytes Relative: 11 %
Lymphs Abs: 1.4 10*3/uL (ref 0.7–4.0)
MCH: 24.4 pg — ABNORMAL LOW (ref 26.0–34.0)
MCHC: 31.3 g/dL (ref 30.0–36.0)
MCV: 78.2 fL — ABNORMAL LOW (ref 80.0–100.0)
Monocytes Absolute: 0.6 10*3/uL (ref 0.1–1.0)
Monocytes Relative: 5 %
Neutro Abs: 9.2 10*3/uL — ABNORMAL HIGH (ref 1.7–7.7)
Neutrophils Relative %: 74 %
Platelets: 213 10*3/uL (ref 150–400)
RBC: 3.48 MIL/uL — ABNORMAL LOW (ref 3.87–5.11)
RDW: 13.9 % (ref 11.5–15.5)
WBC MORPHOLOGY: INCREASED
WBC: 12.4 10*3/uL — ABNORMAL HIGH (ref 4.0–10.5)
nRBC: 0 % (ref 0.0–0.2)

## 2018-04-17 LAB — BASIC METABOLIC PANEL
Anion gap: 17 — ABNORMAL HIGH (ref 5–15)
BUN: 40 mg/dL — ABNORMAL HIGH (ref 6–20)
CO2: 17 mmol/L — ABNORMAL LOW (ref 22–32)
Calcium: 7.2 mg/dL — ABNORMAL LOW (ref 8.9–10.3)
Chloride: 105 mmol/L (ref 98–111)
Creatinine, Ser: 2.56 mg/dL — ABNORMAL HIGH (ref 0.44–1.00)
GFR calc Af Amer: 27 mL/min — ABNORMAL LOW (ref >60)
GFR, EST NON AFRICAN AMERICAN: 24 mL/min — AB (ref >60)
Glucose, Bld: 85 mg/dL (ref 70–99)
Potassium: 3.4 mmol/L — ABNORMAL LOW (ref 3.5–5.1)
Sodium: 139 mmol/L (ref 135–145)

## 2018-04-17 MED ORDER — POTASSIUM CHLORIDE CRYS ER 20 MEQ PO TBCR
40.0000 meq | EXTENDED_RELEASE_TABLET | Freq: Once | ORAL | Status: AC
  Administered 2018-04-17: 40 meq via ORAL
  Filled 2018-04-17: qty 2

## 2018-04-17 MED ORDER — ZOLPIDEM TARTRATE 5 MG PO TABS
5.0000 mg | ORAL_TABLET | Freq: Once | ORAL | Status: AC
  Administered 2018-04-18: 5 mg via ORAL
  Filled 2018-04-17: qty 1

## 2018-04-17 MED ORDER — HYDROCOD POLST-CPM POLST ER 10-8 MG/5ML PO SUER
5.0000 mL | Freq: Once | ORAL | Status: AC
  Administered 2018-04-18: 5 mL via ORAL
  Filled 2018-04-17: qty 5

## 2018-04-17 NOTE — Progress Notes (Signed)
Patient Demographics:    Hannah Harvey, is a 32 y.o. female, DOB - November 11, 1985, [id number]  Admit date - 04/15/2018   Admitting Physician Frederik Pear, MD  Outpatient Primary MD for the patient is Gregor Hams, FNP  LOS - 2   Chief Complaint  Patient presents with  . Shortness of Breath        Subjective:    Hannah Harvey today has no fevers, no emesis,  No chest pain, shortness of breath at rest continues to improve, dyspnea on exertion remains significant, hypoxia persist,  Assessment  & Plan :    Principal Problem:   Sepsis  Active Problems:   RUL and LLL Pneumonia   Hypoxic Respiratory failure, acute (HCC)   AKI (acute kidney injury) [facility])  Brief summary 32 year old previously healthy female admitted on 04/15/2018 with sepsis secondary to pneumonia, patient was found to have acute hypoxic respiratory failure as well as acute kidney injury in the setting of sepsis   Plan:-  1)Sepsis--- POA----secondary to bilateral strep pneumonia, Legionella test negative, respiratory panel negative, azithromycin discontinued, continue Rocephin, patient met sepsis criteria on admission with tachycardia, tachypnea, hypoxia, AKI reflecting endorgan damage and lactic acidosis reflecting poor tissue perfusion--- overall sepsis pathophysiology appears to be resolving with treatment, WBC trending up   2)Bilateral Strep Pneumonia------ patient with right upper lobe and left lower lobe pneumonia with hypoxia, treat as above #1 pending cultures  3) acute hypoxic respiratory failure--- secondary to #1 and #2 above treat as above 1 and #2 , no longer requiring BiPAP, try to wean off O2 slowly, prior to admission patient was previously healthy,  no underlying lung disease., no history of tobacco use  4)AKI----acute kidney injury -due to sepsis with decreased renal perfusion,  creatinine on  admission= 4.88 ,   baseline creatinine =0.78    , creatinine is now= 2.56 , renally adjust medications, avoid nephrotoxic agents / dehydration /hypotension   Disposition/Need for in-Hospital Stay- patient unable to be discharged at this time due to acute hypoxic respiratory failure secondary to pneumonia and sepsis requiring IV antibiotics and supplemental oxygen, also renal function improving slowly... dyspnea on exertion remains significant, hypoxia persist,  Code Status : full  Disposition Plan  : TBD  Consults  :  PCCM  DVT Prophylaxis  :  Lovenox -   Lab Results  Component Value Date   PLT 213 04/17/2018    Inpatient Medications  Scheduled Meds: . enoxaparin (LOVENOX) injection  30 mg Subcutaneous Daily   Continuous Infusions: . sodium chloride 75 mL/hr at 04/17/18 0509  . cefTRIAXone (ROCEPHIN)  IV 2 g (04/17/18 0512)   PRN Meds:.[COMPLETED] acetaminophen **FOLLOWED BY** acetaminophen, fentaNYL (SUBLIMAZE) injection, guaiFENesin-dextromethorphan    Anti-infectives (From admission, onward)   Start     Dose/Rate Route Frequency Ordered Stop   04/15/18 0630  oseltamivir (TAMIFLU) capsule 75 mg  Status:  Discontinued     75 mg Oral  Once 04/15/18 0626 04/16/18 0915   04/15/18 0615  cefTRIAXone (ROCEPHIN) 2 g in sodium chloride 0.9 % 100 mL IVPB     2 g 200 mL/hr over 30 Minutes Intravenous Every 24 hours 04/15/18 0607 04/22/18 0614   04/15/18 0615  azithromycin (ZITHROMAX) 500 mg in sodium chloride  0.9 % 250 mL IVPB  Status:  Discontinued     500 mg 250 mL/hr over 60 Minutes Intravenous Every 24 hours 04/15/18 0607 04/16/18 0915   04/15/18 0609  azithromycin (ZITHROMAX) 500 MG injection    Note to Pharmacy:  Karl Ito   : cabinet override      04/15/18 0609 04/15/18 0618        Objective:   Vitals:   04/17/18 0218 04/17/18 0457 04/17/18 1009 04/17/18 1412  BP: 132/87 115/89 120/78 117/79  Pulse: 99 100 (!) 102 96  Resp: 19 19 (!) 26   Temp: 98.7 F (37.1  C) 98.4 F (36.9 C) 98.5 F (36.9 C) 98.9 F (37.2 C)  TempSrc: Oral Oral Oral Oral  SpO2: 98% 96% 97% 98%  Weight:      Height:        Wt Readings from Last 3 Encounters:  04/15/18 (!) 147 kg  03/04/18 (!) 137 kg  12/06/17 (!) 140.6 kg     Intake/Output Summary (Last 24 hours) at 04/17/2018 1540 Last data filed at 04/16/2018 2309 Gross per 24 hour  Intake 300 ml  Output 1400 ml  Net -1100 ml     Physical Exam Patient is examined daily including today on 04/17/18 , exams remain the same as of yesterday except that has changed   Gen:- Awake Alert, able to speak in short sentences HEENT:- [place].AT, No sclera icterus Nose- [place] 3 L/min Neck-Supple Neck,No JVD,.  Lungs-diminished breath sounds bilaterally with bilateral rhonchi, no significant wheezing CV- S1, S2 normal, regular  Abd-  +ve B.Sounds, Abd Soft, No tenderness,    Extremity/Skin:- No  edema, pedal pulses present  Psych-affect is appropriate, oriented x3 Neuro-no new focal deficits, no tremors   Data Review:   Micro Results Recent Results (from the past 240 hour(s))  Blood culture (routine x 2)     Status: None (Preliminary result)   Collection Time: 04/15/18  5:41 AM  Result Value Ref Range Status   Specimen Description   Final    BLOOD RIGHT ARM Performed at [facility], [address] [place], [place] [address]    Special Requests   Final    BOTTLES DRAWN AEROBIC AND ANAEROBIC Blood Culture adequate volume Performed at [facility], [address] [place], [place] [address]    Culture   Final    NO GROWTH 2 DAYS Performed at [facility], [place] [address], [place], [place] [address]    Report Status PENDING  Incomplete  Blood culture (routine x 2)     Status: None (Preliminary result)   Collection Time: 04/15/18  6:18 AM  Result Value Ref Range Status   Specimen Description   Final    BLOOD LEFT HAND Performed at [facility], [address] [place], [place] [address]    Special Requests   Final    BOTTLES DRAWN AEROBIC AND ANAEROBIC Blood Culture adequate volume Performed at [facility], [address] [place], [place] [address]    Culture   Final    NO GROWTH 2 DAYS Performed at [facility], [place] [address], [place], [place] [address]    Report Status PENDING  Incomplete  Respiratory Panel by PCR     Status: None   Collection Time: 04/15/18 11:01 AM  Result Value Ref Range Status   Adenovirus NOT DETECTED NOT DETECTED Final   Coronavirus 229E NOT  DETECTED NOT DETECTED Final   Coronavirus HKU1 NOT DETECTED NOT DETECTED Final   Coronavirus NL63 NOT DETECTED NOT DETECTED Final   Coronavirus OC43 NOT DETECTED NOT DETECTED Final   Metapneumovirus NOT DETECTED NOT DETECTED Final   Rhinovirus / Enterovirus NOT DETECTED NOT DETECTED Final   Influenza A NOT DETECTED NOT DETECTED Final   Influenza B NOT DETECTED NOT DETECTED Final   Parainfluenza Virus 1 NOT DETECTED NOT DETECTED Final   Parainfluenza Virus 2 NOT DETECTED NOT DETECTED Final   Parainfluenza Virus 3 NOT DETECTED NOT DETECTED Final   Parainfluenza Virus 4 NOT DETECTED NOT DETECTED Final   Respiratory Syncytial Virus NOT DETECTED NOT DETECTED Final   Bordetella pertussis NOT DETECTED NOT DETECTED Final   Chlamydophila pneumoniae NOT DETECTED NOT DETECTED Final   Mycoplasma pneumoniae NOT DETECTED NOT DETECTED Final    Comment: Performed at [facility], [place] [address], [place], [place] [address]  MRSA PCR Screening     Status: None   Collection Time: 04/15/18 11:01 AM  Result Value Ref Range Status   MRSA by PCR NEGATIVE NEGATIVE Final    Comment:        The GeneXpert MRSA Assay (FDA approved for NASAL specimens only), is one component of a comprehensive MRSA colonization surveillance program. It is not intended to diagnose MRSA infection nor to guide or monitor treatment for MRSA infections. Performed at [facility], [place]  [address] [place], [place] [address]     Radiology Reports Dg Chest Port 1 View  Result Date: 04/16/2018 CLINICAL DATA:  Chest tightness.  Shortness of breath. EXAM: PORTABLE CHEST 1 VIEW COMPARISON:  04/15/2018. FINDINGS: Persistent right upper lung atelectasis/consolidation. No change. Progressive prominent left lung infiltrate. Heart size stable. Normal pulmonary vascularity. No acute bony abnormality. IMPRESSION: 1. Persistent right upper lung atelectasis/consolidation. No change. 2.  Progressive left lung base infiltrate. Electronically Signed   By: Marcello Moores  Register   On: 04/16/2018 08:52   Dg Chest Portable 1 View  Result Date: 04/15/2018 CLINICAL DATA:  Initial evaluation for acute shortness of breath. EXAM: PORTABLE CHEST 1 VIEW COMPARISON:  None. FINDINGS: Mild cardiomegaly. Mediastinal silhouette within normal limits. Lungs hypoinflated. Dense consolidative opacity present within the right upper lobe, concerning for pneumonia. Associated air bronchograms. DISH in ule patchy left perihilar infiltrate also concerning for infection. Underlying perihilar vascular congestion without overt pulmonary edema. No pleural effusion. No pneumothorax. No acute osseous abnormality. IMPRESSION: Multifocal infiltrates involving the right upper lobe and left perihilar region, concerning for multifocal pneumonia. Electronically Signed   By: Jeannine Boga M.D.   On: 04/15/2018 05:56     CBC Recent Labs  Lab 04/15/18 0543 04/15/18 1359 04/16/18 0727 04/17/18 0235  WBC 6.6 6.8 9.1 12.4*  HGB 11.5* 10.2* 8.8* 8.5*  HCT 37.9 33.5* 29.0* 27.2*  PLT 293 249 216 213  MCV 80.8 80.3 79.5* 78.2*  MCH 24.5* 24.5* 24.1* 24.4*  MCHC 30.3 30.4 30.3 31.3  RDW 15.3 14.9 14.5 13.9  LYMPHSABS 1.1  --  1.9 1.4  MONOABS 0.2  --  0.8 0.6  EOSABS 0.0  --  0.1 0.2  BASOSABS 0.0  --  0.0 0.0    Chemistries  Recent Labs  Lab 04/15/18 0543 04/15/18 1359 04/15/18 1902 04/16/18 0727 04/17/18 0235  NA 138  --   139 135 139  K 3.7  --  4.1 3.7 3.4*  CL 102  --  103 103 105  CO2 14*  --  15* 15* 17*  GLUCOSE 121*  --  100* 103* 85  BUN 30*  --  39* 45* 40*  CREATININE 4.88* 4.78* 4.67* 3.65* 2.56*  CALCIUM 8.5*  --  7.2* 6.5* 7.2*  AST 49*  --   --   --   --   ALT 42  --   --   --   --   ALKPHOS 67  --   --   --   --   BILITOT 1.3*  --   --   --   --    ------------------------------------------------------------------------------------------------------------------ No results for input(s): CHOL, HDL, LDLCALC, TRIG, CHOLHDL, LDLDIRECT in the last 72 hours.  No results found for: HGBA1C ------------------------------------------------------------------------------------------------------------------ No results for input(s): TSH, T4TOTAL, T3FREE, THYROIDAB in the last 72 hours.  Invalid input(s): FREET3 ------------------------------------------------------------------------------------------------------------------ No results for input(s): VITAMINB12, FOLATE, FERRITIN, TIBC, IRON, RETICCTPCT in the last 72 hours.  Coagulation profile No results for input(s): INR, PROTIME in the last 168 hours.  No results for input(s): DDIMER in the last 72 hours.  Cardiac Enzymes No results for input(s): CKMB, TROPONINI, MYOGLOBIN in the last 168 hours.  Invalid input(s): CK ------------------------------------------------------------------------------------------------------------------    Component Value Date/Time   BNP 1,013.5 (H) 04/15/2018 7858     Roxan Hockey M.D on 04/17/2018 at 3:40 PM  Pager---[phone] Go to [email or web address] - password [email or web address] for contact info  [facility] - Office  [phone]

## 2018-04-18 LAB — URINALYSIS, ROUTINE W REFLEX MICROSCOPIC
Bilirubin Urine: NEGATIVE
GLUCOSE, UA: NEGATIVE mg/dL
Ketones, ur: NEGATIVE mg/dL
Nitrite: NEGATIVE
Protein, ur: NEGATIVE mg/dL
Specific Gravity, Urine: 1.013 (ref 1.005–1.030)
pH: 6 (ref 5.0–8.0)

## 2018-04-18 LAB — CBC WITH DIFFERENTIAL/PLATELET
Band Neutrophils: 0 %
Basophils Absolute: 0 10*3/uL (ref 0.0–0.1)
Basophils Relative: 0 %
Blasts: 0 %
Eosinophils Absolute: 0.5 10*3/uL (ref 0.0–0.5)
Eosinophils Relative: 2 %
HCT: 25.3 % — ABNORMAL LOW (ref 36.0–46.0)
Hemoglobin: 8.1 g/dL — ABNORMAL LOW (ref 12.0–15.0)
Lymphocytes Relative: 13 %
Lymphs Abs: 3.2 10*3/uL (ref 0.7–4.0)
MCH: 24.5 pg — ABNORMAL LOW (ref 26.0–34.0)
MCHC: 32 g/dL (ref 30.0–36.0)
MCV: 76.4 fL — ABNORMAL LOW (ref 80.0–100.0)
MYELOCYTES: 0 %
Metamyelocytes Relative: 0 %
Monocytes Absolute: 1.7 10*3/uL — ABNORMAL HIGH (ref 0.1–1.0)
Monocytes Relative: 7 %
NRBC: 0.1 % (ref 0.0–0.2)
Neutro Abs: 18.9 10*3/uL — ABNORMAL HIGH (ref 1.7–7.7)
Neutrophils Relative %: 78 %
Other: 0 %
PROMYELOCYTES RELATIVE: 0 %
Platelets: 209 10*3/uL (ref 150–400)
RBC: 3.31 MIL/uL — ABNORMAL LOW (ref 3.87–5.11)
RDW: 13.7 % (ref 11.5–15.5)
WBC MORPHOLOGY: INCREASED
WBC: 24.3 10*3/uL — ABNORMAL HIGH (ref 4.0–10.5)
nRBC: 0 /100 WBC

## 2018-04-18 LAB — BASIC METABOLIC PANEL
Anion gap: 11 (ref 5–15)
BUN: 27 mg/dL — ABNORMAL HIGH (ref 6–20)
CO2: 18 mmol/L — ABNORMAL LOW (ref 22–32)
Calcium: 7.8 mg/dL — ABNORMAL LOW (ref 8.9–10.3)
Chloride: 107 mmol/L (ref 98–111)
Creatinine, Ser: 1.52 mg/dL — ABNORMAL HIGH (ref 0.44–1.00)
GFR calc Af Amer: 51 mL/min — ABNORMAL LOW (ref >60)
GFR calc non Af Amer: 44 mL/min — ABNORMAL LOW (ref >60)
Glucose, Bld: 118 mg/dL — ABNORMAL HIGH (ref 70–99)
Potassium: 3.1 mmol/L — ABNORMAL LOW (ref 3.5–5.1)
Sodium: 136 mmol/L (ref 135–145)

## 2018-04-18 MED ORDER — MENTHOL 3 MG MT LOZG: 1.0000 | LOZENGE | OROMUCOSAL | Status: DC | PRN

## 2018-04-18 MED ORDER — SODIUM CHLORIDE 0.9 % IV SOLN
500.0000 mg | Freq: Every day | INTRAVENOUS | Status: DC
  Administered 2018-04-18 – 2018-04-20 (×3): 500 mg via INTRAVENOUS
  Filled 2018-04-18 (×3): qty 500

## 2018-04-18 MED ORDER — POTASSIUM CHLORIDE CRYS ER 20 MEQ PO TBCR
40.0000 meq | EXTENDED_RELEASE_TABLET | Freq: Once | ORAL | Status: AC
  Administered 2018-04-18: 40 meq via ORAL
  Filled 2018-04-18: qty 2

## 2018-04-18 MED ORDER — HYDROCOD POLST-CPM POLST ER 10-8 MG/5ML PO SUER
5.0000 mL | Freq: Once | ORAL | Status: AC
  Administered 2018-04-18: 5 mL via ORAL
  Filled 2018-04-18: qty 5

## 2018-04-18 MED ORDER — ENOXAPARIN SODIUM 80 MG/0.8ML ~~LOC~~ SOLN
0.5000 mg/kg | Freq: Every day | SUBCUTANEOUS | Status: DC
  Administered 2018-04-19 – 2018-04-22 (×4): 75 mg via SUBCUTANEOUS
  Filled 2018-04-18 (×4): qty 0.8

## 2018-04-18 MED ORDER — MENTHOL 3 MG MT LOZG
1.0000 | LOZENGE | Freq: Once | OROMUCOSAL | Status: AC
  Administered 2018-04-18: 3 mg via ORAL
  Filled 2018-04-18: qty 9

## 2018-04-18 NOTE — Progress Notes (Signed)
Pt urinalysis and urine culture not yet collected at this time will try again next time, pt had bowel movement too.

## 2018-04-18 NOTE — Progress Notes (Signed)
Patient Demographics:    Hannah Harvey, is a 32 y.o. female, DOB - [birth year], [id number]  Admit date - 04/15/2018   Admitting Physician Frederik Pear, MD  Outpatient Primary MD for the patient is Gregor Hams, FNP  LOS - 3   Chief Complaint  Patient presents with  . Shortness of Breath        Subjective:    Hannah Harvey today has no fevers, no emesis,  No chest pain, ambulating to bathroom with some dyspnea on exertion, continues to require oxygen  Assessment  & Plan :    Principal Problem:   Sepsis  Active Problems:   RUL and LLL Pneumonia   Hypoxic Respiratory failure, acute (HCC)   AKI (acute kidney injury) [facility])  Brief summary 32 year old previously healthy female admitted on 04/15/2018 with sepsis secondary to pneumonia, patient was found to have acute hypoxic respiratory failure as well as acute kidney injury in the setting of sepsis   Plan:-  1)Sepsis--- POA----secondary to bilateral strep pneumonia, Legionella test negative, respiratory panel negative, azithromycin was initially discontinued, given persistent respiratory symptoms and increasing WBC will add azithromycin, repeat two-view chest x-ray in the a.m., continue Rocephin, patient met sepsis criteria on admission with tachycardia, tachypnea, hypoxia, AKI reflecting endorgan damage and lactic acidosis reflecting poor tissue perfusion---    2)Bilateral Strep Pneumonia------ patient with right upper lobe and left lower lobe pneumonia with hypoxia, treat as above #1 pending cultures  3)Acute Hypoxic Respiratory Failure--- secondary to #1 and #2 above treat as above 1 and #2 , no longer requiring BiPAP, try to wean off O2 slowly, prior to admission patient was previously healthy,  no underlying lung disease., no history of tobacco use  4)AKI----acute kidney injury -due to sepsis with decreased renal  perfusion,  creatinine on admission= 4.88 ,   baseline creatinine =0.78    , creatinine is now= 1.52 , renally adjust medications, avoid nephrotoxic agents / dehydration /hypotension, renal function continues to improve with hydration, patient is voiding well,  5)Leukocytosis--white count is up to 24.3 from 12.4, query etiology, may be worsening pneumonia, so azithromycin added as above #1, repeat blood cultures requested, repeat chest x-ray in a.m., cannot rule out concomitant UTI, patient had a Foley catheter in for several days, Foley was taken out on 04/18/2018, will get UA and UC.... No fevers, no chills,   Disposition/Need for in-Hospital Stay- patient unable to be discharged at this time due to acute hypoxic respiratory failure secondary to pneumonia and sepsis requiring IV antibiotics and supplemental oxygen,  dyspnea on exertion remains significant, hypoxia persist,  Code Status : full  Disposition Plan  : TBD  Consults  :  PCCM  DVT Prophylaxis  :  Lovenox -   Lab Results  Component Value Date   PLT 209 04/18/2018   Inpatient Medications  Scheduled Meds: . chlorpheniramine-HYDROcodone  5 mL Oral Once  . [START ON 04/19/2018] enoxaparin (LOVENOX) injection  0.5 mg/kg Subcutaneous Daily  . menthol-cetylpyridinium  1 lozenge Oral Once   Continuous Infusions: . sodium chloride 75 mL/hr at 04/18/18 0845  . azithromycin    . cefTRIAXone (ROCEPHIN)  IV 2 g (04/18/18 0554)   PRN Meds:.[COMPLETED] acetaminophen **FOLLOWED BY** acetaminophen, fentaNYL (SUBLIMAZE) injection, guaiFENesin-dextromethorphan, menthol-cetylpyridinium  Anti-infectives (From admission, onward)   Start     Dose/Rate Route Frequency Ordered Stop   04/18/18 1600  azithromycin (ZITHROMAX) 500 mg in sodium chloride 0.9 % 250 mL IVPB     500 mg 250 mL/hr over 60 Minutes Intravenous Daily 04/18/18 1548 04/23/18 0959   04/15/18 0630  oseltamivir (TAMIFLU) capsule 75 mg  Status:  Discontinued     75 mg Oral   Once 04/15/18 0626 04/16/18 0915   04/15/18 0615  cefTRIAXone (ROCEPHIN) 2 g in sodium chloride 0.9 % 100 mL IVPB     2 g 200 mL/hr over 30 Minutes Intravenous Every 24 hours 04/15/18 0607 04/22/18 0614   04/15/18 0615  azithromycin (ZITHROMAX) 500 mg in sodium chloride 0.9 % 250 mL IVPB  Status:  Discontinued     500 mg 250 mL/hr over 60 Minutes Intravenous Every 24 hours 04/15/18 0607 04/16/18 0915   04/15/18 0609  azithromycin (ZITHROMAX) 500 MG injection    Note to Pharmacy:  Karl Ito   : cabinet override      04/15/18 0609 04/15/18 0618        Objective:   Vitals:   04/17/18 1412 04/17/18 2158 04/18/18 0502 04/18/18 1524  BP: 117/79 119/79 133/83 118/84  Pulse: 96 (!) 102 97 (!) 105  Resp:  '16 16 20  '$ Temp: 98.9 F (37.2 C) 98.8 F (37.1 C) 98.7 F (37.1 C) 98.7 F (37.1 C)  TempSrc: Oral Oral Oral Oral  SpO2: 98% 94% 92% 92%  Weight:      Height:        Wt Readings from Last 3 Encounters:  04/15/18 (!) 147 kg  03/04/18 (!) 137 kg  12/06/17 (!) 140.6 kg    Intake/Output Summary (Last 24 hours) at 04/18/2018 1549 Last data filed at 04/18/2018 1057 Gross per 24 hour  Intake 2677.3 ml  Output 2550 ml  Net 127.3 ml    Physical Exam Patient is examined daily including today on 04/18/18 , exams remain the same as of yesterday except that has changed   Gen:- Awake Alert, able to speak in short sentences HEENT:- [place].AT, No sclera icterus Nose- [place] 2 L/min Neck-Supple Neck,No JVD,.  Lungs-diminished breath sounds bilaterally with bilateral rhonchi, no significant wheezing CV- S1, S2 normal, regular  Abd-  +ve B.Sounds, Abd Soft, No tenderness,    Extremity/Skin:- No  edema, pedal pulses present  Psych-affect is appropriate, oriented x3 Neuro-no new focal deficits, no tremors GU -foley---- to be removed  Data Review:   Micro Results Recent Results (from the past 240 hour(s))  Blood culture (routine x 2)     Status: None (Preliminary result)   Collection Time:  04/15/18  5:41 AM  Result Value Ref Range Status   Specimen Description   Final    BLOOD RIGHT ARM Performed at [facility], [address] [place], [place] [address]    Special Requests   Final    BOTTLES DRAWN AEROBIC AND ANAEROBIC Blood Culture adequate volume Performed at [facility], [address] [place], [place] [address]    Culture   Final    NO GROWTH 3 DAYS Performed at [facility], [place] [address] [place], [place] [address]    Report Status PENDING  Incomplete  Blood culture (routine x 2)     Status: None (Preliminary result)   Collection Time: 04/15/18  6:18 AM  Result Value Ref Range Status   Specimen Description  Final    BLOOD LEFT HAND Performed at [facility], [address] [place], [place] [address]    Special Requests   Final    BOTTLES DRAWN AEROBIC AND ANAEROBIC Blood Culture adequate volume Performed at [facility], [address] [place], [place] [address]    Culture   Final    NO GROWTH 3 DAYS Performed at [facility], [place] [address], [place], [place] [address]    Report Status PENDING  Incomplete  Respiratory Panel by PCR     Status: None   Collection Time: 04/15/18 11:01 AM  Result Value Ref Range Status   Adenovirus NOT DETECTED NOT DETECTED Final   Coronavirus 229E NOT DETECTED NOT DETECTED Final   Coronavirus HKU1 NOT DETECTED NOT DETECTED Final   Coronavirus NL63 NOT DETECTED NOT DETECTED Final   Coronavirus OC43 NOT DETECTED NOT DETECTED Final   Metapneumovirus NOT DETECTED NOT DETECTED Final   Rhinovirus / Enterovirus NOT DETECTED NOT DETECTED Final   Influenza A NOT DETECTED NOT DETECTED Final   Influenza B NOT DETECTED NOT DETECTED Final   Parainfluenza Virus 1 NOT DETECTED NOT DETECTED Final   Parainfluenza Virus 2 NOT DETECTED NOT DETECTED Final   Parainfluenza Virus 3 NOT DETECTED NOT DETECTED Final   Parainfluenza Virus 4 NOT DETECTED NOT DETECTED Final    Respiratory Syncytial Virus NOT DETECTED NOT DETECTED Final   Bordetella pertussis NOT DETECTED NOT DETECTED Final   Chlamydophila pneumoniae NOT DETECTED NOT DETECTED Final   Mycoplasma pneumoniae NOT DETECTED NOT DETECTED Final    Comment: Performed at [facility], [place] [address], [place], [place] [address]  MRSA PCR Screening     Status: None   Collection Time: 04/15/18 11:01 AM  Result Value Ref Range Status   MRSA by PCR NEGATIVE NEGATIVE Final    Comment:        The GeneXpert MRSA Assay (FDA approved for NASAL specimens only), is one component of a comprehensive MRSA colonization surveillance program. It is not intended to diagnose MRSA infection nor to guide or monitor treatment for MRSA infections. Performed at [facility], [place] [address], [place], [place] [address]     Radiology Reports Dg Chest Port 1 View  Result Date: 04/16/2018 CLINICAL DATA:  Chest tightness.  Shortness of breath. EXAM: PORTABLE CHEST 1 VIEW COMPARISON:  04/15/2018. FINDINGS: Persistent right upper lung atelectasis/consolidation. No change. Progressive prominent left lung infiltrate. Heart size stable. Normal pulmonary vascularity. No acute bony abnormality. IMPRESSION: 1. Persistent right upper lung atelectasis/consolidation. No change. 2.  Progressive left lung base infiltrate. Electronically Signed   By: Marcello Moores  Register   On: 04/16/2018 08:52   Dg Chest Portable 1 View  Result Date: 04/15/2018 CLINICAL DATA:  Initial evaluation for acute shortness of breath. EXAM: PORTABLE CHEST 1 VIEW COMPARISON:  None. FINDINGS: Mild cardiomegaly. Mediastinal silhouette within normal limits. Lungs hypoinflated. Dense consolidative opacity present within the right upper lobe, concerning for pneumonia. Associated air bronchograms. DISH in ule patchy left perihilar infiltrate also concerning for infection. Underlying perihilar vascular congestion without overt pulmonary edema. No pleural effusion. No  pneumothorax. No acute osseous abnormality. IMPRESSION: Multifocal infiltrates involving the right upper lobe and left perihilar region, concerning for multifocal pneumonia. Electronically Signed   By: Jeannine Boga M.D.   On: 04/15/2018 05:56    CBC Recent Labs  Lab 04/15/18 0543 04/15/18 1359 04/16/18 0727 04/17/18 0235 04/18/18 0303  WBC 6.6 6.8  9.1 12.4* 24.3*  HGB 11.5* 10.2* 8.8* 8.5* 8.1*  HCT 37.9 33.5* 29.0* 27.2* 25.3*  PLT 293 249 216 213 209  MCV 80.8 80.3 79.5* 78.2* 76.4*  MCH 24.5* 24.5* 24.1* 24.4* 24.5*  MCHC 30.3 30.4 30.3 31.3 32.0  RDW 15.3 14.9 14.5 13.9 13.7  LYMPHSABS 1.1  --  1.9 1.4 3.2  MONOABS 0.2  --  0.8 0.6 1.7*  EOSABS 0.0  --  0.1 0.2 0.5  BASOSABS 0.0  --  0.0 0.0 0.0   Chemistries  Recent Labs  Lab 04/15/18 0543 04/15/18 1359 04/15/18 1902 04/16/18 0727 04/17/18 0235 04/18/18 0303  NA 138  --  139 135 139 136  K 3.7  --  4.1 3.7 3.4* 3.1*  CL 102  --  103 103 105 107  CO2 14*  --  15* 15* 17* 18*  GLUCOSE 121*  --  100* 103* 85 118*  BUN 30*  --  39* 45* 40* 27*  CREATININE 4.88* 4.78* 4.67* 3.65* 2.56* 1.52*  CALCIUM 8.5*  --  7.2* 6.5* 7.2* 7.8*  AST 49*  --   --   --   --   --   ALT 42  --   --   --   --   --   ALKPHOS 67  --   --   --   --   --   BILITOT 1.3*  --   --   --   --   --       Component Value Date/Time   BNP 1,013.5 (H) 04/15/2018 1224   Roxan Hockey M.D on 04/18/2018 at 3:49 PM  Pager---[phone] Go to [email or web address] - password [email or web address] for contact info  [facility] - Office  [phone]

## 2018-04-19 ENCOUNTER — Inpatient Hospital Stay (HOSPITAL_COMMUNITY): Payer: 59

## 2018-04-19 LAB — BASIC METABOLIC PANEL
Anion gap: 12 (ref 5–15)
BUN: 16 mg/dL (ref 6–20)
CO2: 19 mmol/L — ABNORMAL LOW (ref 22–32)
Calcium: 8.2 mg/dL — ABNORMAL LOW (ref 8.9–10.3)
Chloride: 106 mmol/L (ref 98–111)
Creatinine, Ser: 1.45 mg/dL — ABNORMAL HIGH (ref 0.44–1.00)
GFR calc non Af Amer: 47 mL/min — ABNORMAL LOW (ref >60)
GFR, EST AFRICAN AMERICAN: 54 mL/min — AB (ref >60)
Glucose, Bld: 95 mg/dL (ref 70–99)
Potassium: 4.6 mmol/L (ref 3.5–5.1)
SODIUM: 137 mmol/L (ref 135–145)

## 2018-04-19 LAB — CBC WITH DIFFERENTIAL/PLATELET
Band Neutrophils: 0 %
Basophils Absolute: 0.4 10*3/uL — ABNORMAL HIGH (ref 0.0–0.1)
Basophils Relative: 1 %
Blasts: 0 %
EOS ABS: 0.7 10*3/uL — AB (ref 0.0–0.5)
Eosinophils Relative: 2 %
HCT: 30.2 % — ABNORMAL LOW (ref 36.0–46.0)
Hemoglobin: 9 g/dL — ABNORMAL LOW (ref 12.0–15.0)
Lymphocytes Relative: 11 %
Lymphs Abs: 3.9 10*3/uL (ref 0.7–4.0)
MCH: 23.3 pg — ABNORMAL LOW (ref 26.0–34.0)
MCHC: 29.8 g/dL — ABNORMAL LOW (ref 30.0–36.0)
MCV: 78.2 fL — ABNORMAL LOW (ref 80.0–100.0)
MYELOCYTES: 0 %
Metamyelocytes Relative: 0 %
Monocytes Absolute: 2.5 10*3/uL — ABNORMAL HIGH (ref 0.1–1.0)
Monocytes Relative: 7 %
Neutro Abs: 27.6 10*3/uL — ABNORMAL HIGH (ref 1.7–7.7)
Neutrophils Relative %: 79 %
Other: 0 %
Platelets: 257 10*3/uL (ref 150–400)
Promyelocytes Relative: 0 %
RBC: 3.86 MIL/uL — ABNORMAL LOW (ref 3.87–5.11)
RDW: 14.3 % (ref 11.5–15.5)
WBC: 35.1 10*3/uL — ABNORMAL HIGH (ref 4.0–10.5)
nRBC: 0 /100 WBC
nRBC: 0.1 % (ref 0.0–0.2)

## 2018-04-19 MED ORDER — GUAIFENESIN ER 600 MG PO TB12
600.0000 mg | ORAL_TABLET | Freq: Two times a day (BID) | ORAL | Status: DC
  Administered 2018-04-19 – 2018-04-23 (×9): 600 mg via ORAL
  Filled 2018-04-19 (×9): qty 1

## 2018-04-19 MED ORDER — GUAIFENESIN-CODEINE 100-10 MG/5ML PO SOLN
10.0000 mL | Freq: Once | ORAL | Status: AC
  Administered 2018-04-19: 10 mL via ORAL
  Filled 2018-04-19: qty 10

## 2018-04-19 MED ORDER — ZOLPIDEM TARTRATE 5 MG PO TABS: 5.0000 mg | ORAL_TABLET | Freq: Once | ORAL | Status: DC

## 2018-04-19 MED ORDER — ZOLPIDEM TARTRATE 5 MG PO TABS
5.0000 mg | ORAL_TABLET | Freq: Every evening | ORAL | Status: DC | PRN
  Administered 2018-04-19 – 2018-04-21 (×3): 5 mg via ORAL
  Filled 2018-04-19 (×3): qty 1

## 2018-04-19 NOTE — Progress Notes (Signed)
Patient Demographics:    Hannah Harvey, is a 32 y.o. female, DOB - 1985/12/25, GFQ:[id number]  Admit date - 04/15/2018   Admitting Physician Frederik Pear, MD  Outpatient Primary MD for the patient is Gregor Hams, FNP  LOS - 4   Chief Complaint  Patient presents with  . Shortness of Breath        Subjective:    Hannah Harvey today has no fevers, no emesis,  No chest pain, no dysuria, no vomiting or diarrhea  Assessment  & Plan :    Principal Problem:   Sepsis  Active Problems:   RUL and LLL Pneumonia   Hypoxic Respiratory failure, acute (HCC)   AKI (acute kidney injury) ([place])  Brief summary 32 year old previously healthy female admitted on 04/15/2018 with sepsis secondary to pneumonia, patient was found to have acute hypoxic respiratory failure as well as acute kidney injury in the setting of sepsis   Plan:-  1)Sepsis--- POA----secondary to bilateral strep pneumonia, Legionella test negative, respiratory panel negative, WBC trending up (35.1)..... discussed with infectious disease consultant Dr. Baxter Flattery, suspect leukemoid reaction in an otherwise healthy female, continue azithromycin and 2 g of IV Rocephin daily, repeat two-view chest x-ray on 04/19/2018 with persistent bilateral infiltrates however aeration is better, , patient met sepsis criteria on admission with tachycardia, tachypnea, hypoxia, AKI reflecting end-organ damage and lactic acidosis reflecting poor tissue perfusion---  .Marland Kitchen Change IV fluids to [facility] due to risk of volume overload  2)Bilateral Strep Pneumonia------ patient with right upper lobe and left lower lobe pneumonia with hypoxia, treat as above #1   3)Acute Hypoxic Respiratory Failure--- secondary to #1 and #2 above treat as above 1 and #2 , no longer requiring BiPAP, continues to require supplemental oxygen, prior to admission patient was previously  healthy,  no underlying lung disease., no history of tobacco use  4)AKI----acute kidney injury -due to sepsis with decreased renal perfusion, creatinine on admission= 4.88 ,   baseline creatinine =0.78    , creatinine is now= 1.4 , renally adjust medications, avoid nephrotoxic agents / dehydration /hypotension, renal function continues to improve with hydration, patient is voiding well,  5)Leukocytosis--white count is up to 35.1, UA noted, but no urinary symptoms otherwise, repeat chest x-ray from 04/19/2018 noted, blood cultures negative so far, discussed with infectious disease consultant Dr. Baxter Flattery, suspect leukemoid reaction in an otherwise healthy female,, ... No fevers, no chills, treat as above #1   Disposition/Need for in-Hospital Stay- patient unable to be discharged at this time due to acute hypoxic respiratory failure secondary to pneumonia and sepsis requiring IV antibiotics and supplemental oxygen, WBC trending up,  dyspnea on exertion remains significant, hypoxia persist,  Code Status : full  Disposition Plan  : TBD  Consults  :  PCCM/curbside verbal consult with computer/chart review with ID physician Dr. Baxter Flattery  DVT Prophylaxis  :  Lovenox -   Lab Results  Component Value Date   PLT 257 04/19/2018   Inpatient Medications  Scheduled Meds: . enoxaparin (LOVENOX) injection  0.5 mg/kg Subcutaneous Daily   Continuous Infusions: . sodium chloride 10 mL/hr at 04/19/18 1159  . azithromycin 500 mg (04/19/18 0856)  . cefTRIAXone (ROCEPHIN)  IV 2 g (04/19/18 0545)   PRN Meds:.[COMPLETED] acetaminophen **  FOLLOWED BY** acetaminophen, fentaNYL (SUBLIMAZE) injection, guaiFENesin-dextromethorphan, menthol-cetylpyridinium, zolpidem    Anti-infectives (From admission, onward)   Start     Dose/Rate Route Frequency Ordered Stop   04/18/18 1600  azithromycin (ZITHROMAX) 500 mg in sodium chloride 0.9 % 250 mL IVPB     500 mg 250 mL/hr over 60 Minutes Intravenous Daily 04/18/18 1548  04/23/18 0959   04/15/18 0630  oseltamivir (TAMIFLU) capsule 75 mg  Status:  Discontinued     75 mg Oral  Once 04/15/18 0626 04/16/18 0915   04/15/18 0615  cefTRIAXone (ROCEPHIN) 2 g in sodium chloride 0.9 % 100 mL IVPB     2 g 200 mL/hr over 30 Minutes Intravenous Every 24 hours 04/15/18 0607 04/22/18 0614   04/15/18 0615  azithromycin (ZITHROMAX) 500 mg in sodium chloride 0.9 % 250 mL IVPB  Status:  Discontinued     500 mg 250 mL/hr over 60 Minutes Intravenous Every 24 hours 04/15/18 0607 04/16/18 0915   04/15/18 0609  azithromycin (ZITHROMAX) 500 MG injection    Note to Pharmacy:  Karl Ito   : cabinet override      04/15/18 0609 04/15/18 0618        Objective:   Vitals:   04/19/18 0505 04/19/18 0541 04/19/18 1300 04/19/18 1315  BP: 130/84  (!) 130/96   Pulse: (!) 111  (!) 112   Resp: (!) 24 (!) 21 (!) 38 (!) 26  Temp: 99 F (37.2 C)  98.3 F (36.8 C)   TempSrc: Oral  Oral   SpO2: 90% 95% (!) 89% 92%  Weight:      Height:        Wt Readings from Last 3 Encounters:  04/15/18 (!) 147 kg  03/04/18 (!) 137 kg  12/06/17 (!) 140.6 kg    Intake/Output Summary (Last 24 hours) at 04/19/2018 1447 Last data filed at 04/19/2018 1100 Gross per 24 hour  Intake 3167.32 ml  Output 925 ml  Net 2242.32 ml    Physical Exam Patient is examined daily including today on 04/19/18 , exams remain the same as of yesterday except that has changed   Gen:- Awake Alert, able to speak in short sentences HEENT:- [place].AT, No sclera icterus Nose- [place] 2 L/min Neck-Supple Neck,No JVD,.  Lungs-diminished breath sounds bilaterally with bilateral rhonchi, slight wheezing CV- S1, S2 normal, regular  Abd-  +ve B.Sounds, Abd Soft, No tenderness,    Extremity/Skin:- No  edema, pedal pulses present  Psych-affect is appropriate, oriented x3 Neuro-no new focal deficits, no tremors   Data Review:   Micro Results Recent Results (from the past 240 hour(s))  Blood culture (routine x 2)     Status: None  (Preliminary result)   Collection Time: 04/15/18  5:41 AM  Result Value Ref Range Status   Specimen Description   Final    BLOOD RIGHT ARM Performed at [facility], [address] [place], [place] [address]    Special Requests   Final    BOTTLES DRAWN AEROBIC AND ANAEROBIC Blood Culture adequate volume Performed at [facility], [address] [place], [place] [address]    Culture   Final    NO GROWTH 4 DAYS Performed at [facility], [place] [address] [place], [place] [address]    Report Status PENDING  Incomplete  Blood culture (routine x 2)     Status: None (Preliminary result)   Collection Time: 04/15/18  6:18 AM  Result Value Ref Range  Status   Specimen Description   Final    BLOOD LEFT HAND Performed at [facility], [address] [place], [place] [address]    Special Requests   Final    BOTTLES DRAWN AEROBIC AND ANAEROBIC Blood Culture adequate volume Performed at [facility], [address] [place], [place] [address]    Culture   Final    NO GROWTH 4 DAYS Performed at [facility], [place] [address] [place], [place] [address]    Report Status PENDING  Incomplete  Respiratory Panel by PCR     Status: None   Collection Time: 04/15/18 11:01 AM  Result Value Ref Range Status   Adenovirus NOT DETECTED NOT DETECTED Final   Coronavirus 229E NOT DETECTED NOT DETECTED Final   Coronavirus HKU1 NOT DETECTED NOT DETECTED Final   Coronavirus NL63 NOT DETECTED NOT DETECTED Final   Coronavirus OC43 NOT DETECTED NOT DETECTED Final   Metapneumovirus NOT DETECTED NOT DETECTED Final   Rhinovirus / Enterovirus NOT DETECTED NOT DETECTED Final   Influenza A NOT DETECTED NOT DETECTED Final   Influenza B NOT DETECTED NOT DETECTED Final   Parainfluenza Virus 1 NOT DETECTED NOT DETECTED Final   Parainfluenza Virus 2 NOT DETECTED NOT DETECTED Final   Parainfluenza Virus 3 NOT DETECTED NOT DETECTED Final   Parainfluenza Virus 4 NOT  DETECTED NOT DETECTED Final   Respiratory Syncytial Virus NOT DETECTED NOT DETECTED Final   Bordetella pertussis NOT DETECTED NOT DETECTED Final   Chlamydophila pneumoniae NOT DETECTED NOT DETECTED Final   Mycoplasma pneumoniae NOT DETECTED NOT DETECTED Final    Comment: Performed at [facility], [place] [address], [place], [place] [address]  MRSA PCR Screening     Status: None   Collection Time: 04/15/18 11:01 AM  Result Value Ref Range Status   MRSA by PCR NEGATIVE NEGATIVE Final    Comment:        The GeneXpert MRSA Assay (FDA approved for NASAL specimens only), is one component of a comprehensive MRSA colonization surveillance program. It is not intended to diagnose MRSA infection nor to guide or monitor treatment for MRSA infections. Performed at [facility], [place] [address] [place], [place] [address]   Culture, blood (Routine X 2) w Reflex to ID Panel     Status: None (Preliminary result)   Collection Time: 04/18/18 12:39 PM  Result Value Ref Range Status   Specimen Description BLOOD RIGHT ANTECUBITAL  Final   Special Requests   Final    BOTTLES DRAWN AEROBIC ONLY Blood Culture adequate volume   Culture   Final    NO GROWTH 1 DAY Performed at [facility], [place] [address], [place], [place] [address]    Report Status PENDING  Incomplete  Urine Culture     Status: None (Preliminary result)   Collection Time: 04/18/18  7:51 PM  Result Value Ref Range Status   Specimen Description URINE, CLEAN CATCH  Final   Special Requests Normal  Final   Culture   Final    CULTURE REINCUBATED FOR BETTER GROWTH Performed at [facility], [place] [address], [place], [place] [address]    Report Status PENDING  Incomplete    Radiology Reports Dg Chest 2 View  Result Date: 04/19/2018 CLINICAL DATA:  Shortness of breath for 1 week EXAM: CHEST - 2 VIEW COMPARISON:  04/16/2018 FINDINGS: Cardiac shadow is enlarged but stable. Some improved aeration in the  right upper  lobe is noted with near complete resolution of previously seen consolidation. Patchy infiltrate is again noted in the left mid and lower lung. Generalized central perihilar markings are noted most consistent with congestive failure and edema. IMPRESSION: Improved aeration in the right upper lobe although persistent infiltrate is seen. Persistent left basilar infiltrate is noted. New increased perihilar markings consistent with edema. Electronically Signed   By: Inez Catalina M.D.   On: 04/19/2018 06:27   Dg Chest Port 1 View  Result Date: 04/16/2018 CLINICAL DATA:  Chest tightness.  Shortness of breath. EXAM: PORTABLE CHEST 1 VIEW COMPARISON:  04/15/2018. FINDINGS: Persistent right upper lung atelectasis/consolidation. No change. Progressive prominent left lung infiltrate. Heart size stable. Normal pulmonary vascularity. No acute bony abnormality. IMPRESSION: 1. Persistent right upper lung atelectasis/consolidation. No change. 2.  Progressive left lung base infiltrate. Electronically Signed   By: Marcello Moores  Register   On: 04/16/2018 08:52   Dg Chest Portable 1 View  Result Date: 04/15/2018 CLINICAL DATA:  Initial evaluation for acute shortness of breath. EXAM: PORTABLE CHEST 1 VIEW COMPARISON:  None. FINDINGS: Mild cardiomegaly. Mediastinal silhouette within normal limits. Lungs hypoinflated. Dense consolidative opacity present within the right upper lobe, concerning for pneumonia. Associated air bronchograms. DISH in ule patchy left perihilar infiltrate also concerning for infection. Underlying perihilar vascular congestion without overt pulmonary edema. No pleural effusion. No pneumothorax. No acute osseous abnormality. IMPRESSION: Multifocal infiltrates involving the right upper lobe and left perihilar region, concerning for multifocal pneumonia. Electronically Signed   By: Jeannine Boga M.D.   On: 04/15/2018 05:56    CBC Recent Labs  Lab 04/15/18 0543 04/15/18 1359 04/16/18 0727 04/17/18 0235  04/18/18 0303 04/19/18 0219  WBC 6.6 6.8 9.1 12.4* 24.3* 35.1*  HGB 11.5* 10.2* 8.8* 8.5* 8.1* 9.0*  HCT 37.9 33.5* 29.0* 27.2* 25.3* 30.2*  PLT 293 249 216 213 209 257  MCV 80.8 80.3 79.5* 78.2* 76.4* 78.2*  MCH 24.5* 24.5* 24.1* 24.4* 24.5* 23.3*  MCHC 30.3 30.4 30.3 31.3 32.0 29.8*  RDW 15.3 14.9 14.5 13.9 13.7 14.3  LYMPHSABS 1.1  --  1.9 1.4 3.2 3.9  MONOABS 0.2  --  0.8 0.6 1.7* 2.5*  EOSABS 0.0  --  0.1 0.2 0.5 0.7*  BASOSABS 0.0  --  0.0 0.0 0.0 0.4*   Chemistries  Recent Labs  Lab 04/15/18 0543  04/15/18 1902 04/16/18 0727 04/17/18 0235 04/18/18 0303 04/19/18 0219  NA 138  --  139 135 139 136 137  K 3.7  --  4.1 3.7 3.4* 3.1* 4.6  CL 102  --  103 103 105 107 106  CO2 14*  --  15* 15* 17* 18* 19*  GLUCOSE 121*  --  100* 103* 85 118* 95  BUN 30*  --  39* 45* 40* 27* 16  CREATININE 4.88*   < > 4.67* 3.65* 2.56* 1.52* 1.45*  CALCIUM 8.5*  --  7.2* 6.5* 7.2* 7.8* 8.2*  AST 49*  --   --   --   --   --   --   ALT 42  --   --   --   --   --   --   ALKPHOS 67  --   --   --   --   --   --   BILITOT 1.3*  --   --   --   --   --   --    < > = values in this interval not  displayed.      Component Value Date/Time   BNP 1,013.5 (H) 04/15/2018 5053   Roxan Hockey M.D on 04/19/2018 at 2:47 PM  Pager---[phone] Go to [email or web address] - password [email or web address] for contact info  [facility] - Office  [phone]

## 2018-04-19 NOTE — Progress Notes (Signed)
Nurse notified  

## 2018-04-19 NOTE — Plan of Care (Signed)
  Problem: Health Behavior/Discharge Planning: Goal: Ability to manage health-related needs will improve Outcome: Progressing   Problem: Activity: Goal: Risk for activity intolerance will decrease Outcome: Progressing   Problem: Elimination: Goal: Will not experience complications related to bowel motility Outcome: Progressing   Problem: Pain Managment: Goal: General experience of comfort will improve Outcome: Progressing   

## 2018-04-20 ENCOUNTER — Inpatient Hospital Stay (HOSPITAL_COMMUNITY): Payer: 59

## 2018-04-20 DIAGNOSIS — J852 Abscess of lung without pneumonia: Secondary | ICD-10-CM | POA: Diagnosis present

## 2018-04-20 LAB — CBC WITH DIFFERENTIAL/PLATELET
Abs Immature Granulocytes: 3.6 10*3/uL — ABNORMAL HIGH (ref 0.00–0.07)
BASOS PCT: 0 %
Basophils Absolute: 0 10*3/uL (ref 0.0–0.1)
EOS ABS: 0.6 10*3/uL — AB (ref 0.0–0.5)
Eosinophils Relative: 1 %
HCT: 26.1 % — ABNORMAL LOW (ref 36.0–46.0)
Hemoglobin: 8.1 g/dL — ABNORMAL LOW (ref 12.0–15.0)
Immature Granulocytes: 8 %
LYMPHS ABS: 4.6 10*3/uL — AB (ref 0.7–4.0)
Lymphocytes Relative: 11 %
MCH: 23.9 pg — ABNORMAL LOW (ref 26.0–34.0)
MCHC: 31 g/dL (ref 30.0–36.0)
MCV: 77 fL — ABNORMAL LOW (ref 80.0–100.0)
Monocytes Absolute: 3.2 10*3/uL — ABNORMAL HIGH (ref 0.1–1.0)
Monocytes Relative: 7 %
Neutro Abs: 30.9 10*3/uL — ABNORMAL HIGH (ref 1.7–7.7)
Neutrophils Relative %: 73 %
Platelets: 245 10*3/uL (ref 150–400)
RBC: 3.39 MIL/uL — ABNORMAL LOW (ref 3.87–5.11)
RDW: 14.5 % (ref 11.5–15.5)
WBC Morphology: ABNORMAL
WBC: 42.8 10*3/uL — ABNORMAL HIGH (ref 4.0–10.5)
nRBC: 0.2 % (ref 0.0–0.2)

## 2018-04-20 LAB — CULTURE, BLOOD (ROUTINE X 2)
Culture: NO GROWTH
Culture: NO GROWTH
Special Requests: ADEQUATE
Special Requests: ADEQUATE

## 2018-04-20 LAB — BASIC METABOLIC PANEL WITH GFR
Anion gap: 11 (ref 5–15)
BUN: 7 mg/dL (ref 6–20)
CO2: 22 mmol/L (ref 22–32)
Calcium: 8.4 mg/dL — ABNORMAL LOW (ref 8.9–10.3)
Chloride: 104 mmol/L (ref 98–111)
Creatinine, Ser: 1.14 mg/dL — ABNORMAL HIGH (ref 0.44–1.00)
GFR calc Af Amer: >60 mL/min
GFR calc non Af Amer: >60 mL/min
Glucose, Bld: 122 mg/dL — ABNORMAL HIGH (ref 70–99)
Potassium: 3 mmol/L — ABNORMAL LOW (ref 3.5–5.1)
Sodium: 137 mmol/L (ref 135–145)

## 2018-04-20 MED ORDER — POTASSIUM CHLORIDE CRYS ER 20 MEQ PO TBCR
40.0000 meq | EXTENDED_RELEASE_TABLET | Freq: Once | ORAL | Status: AC
  Administered 2018-04-20: 40 meq via ORAL
  Filled 2018-04-20: qty 2

## 2018-04-20 MED ORDER — IOHEXOL 300 MG/ML  SOLN
75.0000 mL | Freq: Once | INTRAMUSCULAR | Status: AC | PRN
  Administered 2018-04-20: 75 mL via INTRAVENOUS

## 2018-04-20 MED ORDER — SODIUM CHLORIDE 0.9 % IV SOLN
3.0000 g | Freq: Four times a day (QID) | INTRAVENOUS | Status: DC
  Administered 2018-04-20 – 2018-04-23 (×12): 3 g via INTRAVENOUS
  Filled 2018-04-20 (×15): qty 3

## 2018-04-20 MED ORDER — POTASSIUM CHLORIDE IN NACL 20-0.9 MEQ/L-% IV SOLN
INTRAVENOUS | Status: DC
  Administered 2018-04-20 – 2018-04-23 (×6): via INTRAVENOUS
  Filled 2018-04-20 (×6): qty 1000

## 2018-04-20 NOTE — Progress Notes (Signed)
Pt c/o having loose stools(4th) every time she pees. Notified on call X. Blount,NP and received new orders  Stool for cdiff and initiate enteric precautions.

## 2018-04-20 NOTE — Plan of Care (Signed)
  Problem: Safety: Goal: Ability to remain free from injury will improve Outcome: Progressing   Problem: Skin Integrity: Goal: Risk for impaired skin integrity will decrease Outcome: Progressing   Problem: Respiratory: Goal: Ability to maintain adequate ventilation will improve Outcome: Progressing Goal: Ability to maintain a clear airway will improve Outcome: Progressing

## 2018-04-20 NOTE — Progress Notes (Signed)
Pharmacy Antibiotic Note  Hannah Harvey is a 32 y.o. female admitted on 04/15/2018 with lung abscess. Previously on ceftriaxone/azithro Pharmacy has been consulted for Unasyn dosing. SCr down to 1.14.  12/5 UC - 20k col E.faecalis 12/5 BCx x 1 - ngtd 12/2 resp panel - neg  Plan: Unasyn 3g IV q6h Monitor clinical progress, c/s, renal function F/u de-escalation plan/LOT   Height: 5\' 5"  (165.1 cm) Weight: (!) 324 lb 1.2 oz (147 kg) IBW/kg (Calculated) : 57  Temp (24hrs), Avg:98.6 F (37 C), Min:98.3 F (36.8 C), Max:98.7 F (37.1 C)  Recent Labs  Lab 04/15/18 0611 04/15/18 0817 04/15/18 1342  04/15/18 1902 04/16/18 0727 04/17/18 0235 04/18/18 0303 04/19/18 0219 04/20/18 0404 04/20/18 1415  WBC  --   --   --    < >  --  9.1 12.4* 24.3* 35.1* 42.8*  --   CREATININE  --   --   --    < > 4.67* 3.65* 2.56* 1.52* 1.45*  --  1.14*  LATICACIDVEN 6.58* 4.44* 3.79*  --  4.0* 1.7  --   --   --   --   --    < > = values in this interval not displayed.    Estimated Creatinine Clearance: 102.1 mL/min (A) (by C-G formula based on SCr of 1.14 mg/dL (H)).    No Known Allergies  Babs BertinHaley Asa Baudoin, PharmD, BCPS Clinical Pharmacist Please check AMION for all [facility] contact numbers 04/20/2018 6:24 PM

## 2018-04-20 NOTE — Progress Notes (Signed)
Patient Demographics:    Tymara Saur, is a 32 y.o. female, DOB - Jun 26, 1985, IWO:[id number]  Admit date - 04/15/2018   Admitting Physician Frederik Pear, MD  Outpatient Primary MD for the patient is Gregor Hams, FNP  LOS - 5   Chief Complaint  Patient presents with  . Shortness of Breath        Subjective:    Taysha Majewski today has no fevers, no emesis,  No chest pain, no dysuria,  Now has diarrhea, dyspnea, tachycardia and cough persist...   Assessment  & Plan :    Principal Problem:   Sepsis  Active Problems:   RUL and LLL Pneumonia   Hypoxic Respiratory failure, acute (HCC)   AKI (acute kidney injury) ([place])   Lung Abscess/Multi-Focal Bilateral Pneumonia  Brief Summary 32 year old previously healthy female admitted on 04/15/2018 with sepsis secondary to pneumonia, patient was found to have acute hypoxic respiratory failure as well as acute kidney injury in the setting of sepsis  CT chest w/contrast 04/20/18 IMPRESSION: Multifocal pneumonia, as described above, with cavitation/abscess in the posterior right upper lobe.   Plan:-  1)Sepsis--- POA----secondary to bilateral strep pneumonia with lung abscess and cavitation Legionella test negative, respiratory panel negative, WBC trending up (42.8)..... discussed with infectious disease consultant Dr. Linus Salmons, CT chest with contrast to rule out lung abscess done on 04/20/2018 consistent with cavitary lesion/abscess in the lungs, start IV Unasyn (04/20/18)  to get some anaerobic coverage, STOP  azithromycin and  Rocephin on 04/20/2018 ,  patient met sepsis criteria on admission with tachycardia, tachypnea, hypoxia, AKI reflecting end-organ damage and lactic acidosis reflecting poor tissue perfusion---  .Marland Kitchen Increase IV fluid rate due to tachycardia and concerns about AKI with contrast study  2)Bilateral Strep Pneumonia/Lung  Abscess/Multi-Focal Bilateral Pneumonia------ patient with right upper lobe and left lower lobe pneumonia with hypoxia, treat as above #1   3)Acute Hypoxic Respiratory Failure--- secondary to #1 and #2 above treat as above 1 and #2 , no longer requiring BiPAP, continues to require supplemental oxygen, prior to admission patient was previously healthy,  no underlying lung disease., no history of tobacco use  4)AKI----acute kidney injury -due to sepsis with decreased renal perfusion, AKI is resolved, however giving contrast study on 04/20/2018 hydrate and watch renal function closely creatinine on admission= 4.88 ,   baseline creatinine =0.78    , creatinine is now= 1.1 , renally adjust medications, avoid nephrotoxic agents / dehydration /hypotension, renal function continues to improve with hydration, patient is voiding well,  5)Leukocytosis--white count is up to > 42,  due to lung abscess/cavitary lesion, UA noted, but no urinary symptoms otherwise, repeat chest x-ray from 04/19/2018 noted, blood cultures negative so far,  treat as above #1   Disposition/Need for in-Hospital Stay- patient unable to be discharged at this time due to acute hypoxic respiratory failure secondary to pneumonia/lung abscess and sepsis requiring IV antibiotics and supplemental oxygen, WBC trending up,  dyspnea on exertion remains significant, hypoxia persist,  Code Status : full  Disposition Plan  : TBD  Consults  :  PCCM/c  ID physician Dr. Linus Salmons DVT Prophylaxis  :  Lovenox -   Lab Results  Component Value Date   PLT 245 04/20/2018   Inpatient  Medications  Scheduled Meds: . enoxaparin (LOVENOX) injection  0.5 mg/kg Subcutaneous Daily  . guaiFENesin  600 mg Oral BID  . potassium chloride  40 mEq Oral Once   Continuous Infusions: . 0.9 % NaCl with KCl 20 mEq / L 50 mL/hr at 04/20/18 1631  . ampicillin-sulbactam (UNASYN) IV     PRN Meds:.[COMPLETED] acetaminophen **FOLLOWED BY** acetaminophen, fentaNYL  (SUBLIMAZE) injection, guaiFENesin-dextromethorphan, menthol-cetylpyridinium, zolpidem    Anti-infectives (From admission, onward)   Start     Dose/Rate Route Frequency Ordered Stop   04/20/18 1830  Ampicillin-Sulbactam (UNASYN) 3 g in sodium chloride 0.9 % 100 mL IVPB     3 g 200 mL/hr over 30 Minutes Intravenous Every 6 hours 04/20/18 1819     04/18/18 1600  azithromycin (ZITHROMAX) 500 mg in sodium chloride 0.9 % 250 mL IVPB  Status:  Discontinued     500 mg 250 mL/hr over 60 Minutes Intravenous Daily 04/18/18 1548 04/20/18 1819   04/15/18 0630  oseltamivir (TAMIFLU) capsule 75 mg  Status:  Discontinued     75 mg Oral  Once 04/15/18 0626 04/16/18 0915   04/15/18 0615  cefTRIAXone (ROCEPHIN) 2 g in sodium chloride 0.9 % 100 mL IVPB  Status:  Discontinued     2 g 200 mL/hr over 30 Minutes Intravenous Every 24 hours 04/15/18 0607 04/20/18 1819   04/15/18 0615  azithromycin (ZITHROMAX) 500 mg in sodium chloride 0.9 % 250 mL IVPB  Status:  Discontinued     500 mg 250 mL/hr over 60 Minutes Intravenous Every 24 hours 04/15/18 0607 04/16/18 0915   04/15/18 0609  azithromycin (ZITHROMAX) 500 MG injection    Note to Pharmacy:  Karl Ito   : cabinet override      04/15/18 0609 04/15/18 0618        Objective:   Vitals:   04/19/18 2147 04/20/18 0527 04/20/18 1122 04/20/18 1347  BP: (!) 144/95 (!) 141/86  137/83  Pulse: (!) 115 (!) 114  (!) 112  Resp: (!) 32 (!) 28  (!) 21  Temp: 98.7 F (37.1 C) 98.6 F (37 C) 98.7 F (37.1 C) 98.3 F (36.8 C)  TempSrc: Oral Oral Oral Oral  SpO2: 93% 96%  94%  Weight:      Height:        Wt Readings from Last 3 Encounters:  04/15/18 (!) 147 kg  03/04/18 (!) 137 kg  12/06/17 (!) 140.6 kg    Intake/Output Summary (Last 24 hours) at 04/20/2018 1829 Last data filed at 04/20/2018 1753 Gross per 24 hour  Intake 1813 ml  Output -  Net 1813 ml    Physical Exam Patient is examined daily including today on 04/20/18 , exams remain the same as  of yesterday except that has changed   Gen:- Awake Alert, able to speak in short sentences HEENT:- [place].AT, No sclera icterus Nose-  2 L/min Neck-Supple Neck,No JVD,.  Lungs-diminished breath sounds bilaterally with bilateral rhonchi, slight wheezing CV- S1, S2 normal, regular  Abd-  +ve B.Sounds, Abd Soft, No tenderness,    Extremity/Skin:- No  edema, pedal pulses present  Psych-affect is appropriate, oriented x3 Neuro-no new focal deficits, no tremors   Data Review:   Micro Results Recent Results (from the past 240 hour(s))  Blood culture (routine x 2)     Status: None   Collection Time: 04/15/18  5:41 AM  Result Value Ref Range Status   Specimen Description   Final    BLOOD RIGHT ARM Performed at  [place], [address] [place], [place] [address]    Special Requests   Final    BOTTLES DRAWN AEROBIC AND ANAEROBIC Blood Culture adequate volume Performed at [facility], [address] [place], [place] [address]    Culture   Final    NO GROWTH 5 DAYS Performed at [facility], [place] [address], [employer] [employer], [place] [address]    Report Status 04/20/2018 FINAL  Final  Blood culture (routine x 2)     Status: None   Collection Time: 04/15/18  6:18 AM  Result Value Ref Range Status   Specimen Description   Final    BLOOD LEFT HAND Performed at [facility], [address] [place], [place] [address]    Special Requests   Final    BOTTLES DRAWN AEROBIC AND ANAEROBIC Blood Culture adequate volume Performed at [facility], [address] [place], [place] [address]    Culture   Final    NO GROWTH 5 DAYS Performed at [facility], [place] [address] [place], [place] [address]    Report Status 04/20/2018 FINAL  Final  Respiratory Panel by PCR     Status: None   Collection Time: 04/15/18 11:01 AM  Result Value Ref Range Status   Adenovirus NOT DETECTED NOT DETECTED Final   Coronavirus 229E NOT DETECTED NOT DETECTED Final    Coronavirus HKU1 NOT DETECTED NOT DETECTED Final   Coronavirus NL63 NOT DETECTED NOT DETECTED Final   Coronavirus OC43 NOT DETECTED NOT DETECTED Final   Metapneumovirus NOT DETECTED NOT DETECTED Final   Rhinovirus / Enterovirus NOT DETECTED NOT DETECTED Final   Influenza A NOT DETECTED NOT DETECTED Final   Influenza B NOT DETECTED NOT DETECTED Final   Parainfluenza Virus 1 NOT DETECTED NOT DETECTED Final   Parainfluenza Virus 2 NOT DETECTED NOT DETECTED Final   Parainfluenza Virus 3 NOT DETECTED NOT DETECTED Final   Parainfluenza Virus 4 NOT DETECTED NOT DETECTED Final   Respiratory Syncytial Virus NOT DETECTED NOT DETECTED Final   Bordetella pertussis NOT DETECTED NOT DETECTED Final   Chlamydophila pneumoniae NOT DETECTED NOT DETECTED Final   Mycoplasma pneumoniae NOT DETECTED NOT DETECTED Final    Comment: Performed at [facility], [place] [address], [place], [place] [address]  MRSA PCR Screening     Status: None   Collection Time: 04/15/18 11:01 AM  Result Value Ref Range Status   MRSA by PCR NEGATIVE NEGATIVE Final    Comment:        The GeneXpert MRSA Assay (FDA approved for NASAL specimens only), is one component of a comprehensive MRSA colonization surveillance program. It is not intended to diagnose MRSA infection nor to guide or monitor treatment for MRSA infections. Performed at [facility], [place] [address] [place], [place] [address]   Culture, blood (Routine X 2) w Reflex to ID Panel     Status: None (Preliminary result)   Collection Time: 04/18/18 12:39 PM  Result Value Ref Range Status   Specimen Description BLOOD RIGHT ANTECUBITAL  Final   Special Requests   Final    BOTTLES DRAWN AEROBIC ONLY Blood Culture adequate volume   Culture   Final    NO GROWTH 2 DAYS Performed at [facility], [place] [address], [place],  [address]    Report Status PENDING  Incomplete  Urine Culture     Status: Abnormal (Preliminary  result)   Collection Time:  04/18/18  7:51 PM  Result Value Ref Range Status   Specimen Description URINE, CLEAN CATCH  Final   Special Requests   Final    Normal Performed at [facility], [place] [address] [place], [place] [address]    Culture 20,000 COLONIES/mL ENTEROCOCCUS FAECALIS (A)  Final   Report Status PENDING  Incomplete    Radiology Reports Dg Chest 2 View  Result Date: 04/19/2018 CLINICAL DATA:  Shortness of breath for 1 week EXAM: CHEST - 2 VIEW COMPARISON:  04/16/2018 FINDINGS: Cardiac shadow is enlarged but stable. Some improved aeration in the right upper lobe is noted with near complete resolution of previously seen consolidation. Patchy infiltrate is again noted in the left mid and lower lung. Generalized central perihilar markings are noted most consistent with congestive failure and edema. IMPRESSION: Improved aeration in the right upper lobe although persistent infiltrate is seen. Persistent left basilar infiltrate is noted. New increased perihilar markings consistent with edema. Electronically Signed   By: Inez Catalina M.D.   On: 04/19/2018 06:27   Ct Chest W Contrast  Result Date: 04/20/2018 CLINICAL DATA:  Cough, pneumonia, leukocytosis EXAM: CT CHEST WITH CONTRAST TECHNIQUE: Multidetector CT imaging of the chest was performed during intravenous contrast administration. CONTRAST:  80m OMNIPAQUE IOHEXOL 300 MG/ML  SOLN COMPARISON:  Chest radiograph dated 04/19/2018 FINDINGS: Cardiovascular: The heart is top-normal in size. No pericardial effusion. No evidence of thoracic aortic aneurysm. Mediastinum/Nodes: Small mediastinal lymph nodes. Visualized thyroid is grossly unremarkable. Lungs/Pleura: Right upper lobe opacity/consolidation with air bronchograms, suggesting pneumonia. Associated cavitation in the posterior aspect of the right upper lobe (series 4/image 48), suggesting cavitary pneumonia/pulmonary abscess. Additional ground-glass opacity with air bronchograms in the right middle lobe, left  upper lobe/lingula, and anterior left lower lobe. Mild patchy/nodular opacity in the right lower lobe. This appearance is compatible with multifocal pneumonia. No definite pleural effusions to suggest superimposed interstitial edema. No pneumothorax. Upper Abdomen: Visualized appear upper abdomen is notable for hepatomegaly with suspected multifocal hepatic cysts, although poorly evaluated. Musculoskeletal: Visualized osseous structures are within normal limits. IMPRESSION: Multifocal pneumonia, as described above, with cavitation/abscess in the posterior right upper lobe. Electronically Signed   By: SJulian HyM.D.   On: 04/20/2018 18:06   Dg Chest Port 1 View  Result Date: 04/16/2018 CLINICAL DATA:  Chest tightness.  Shortness of breath. EXAM: PORTABLE CHEST 1 VIEW COMPARISON:  04/15/2018. FINDINGS: Persistent right upper lung atelectasis/consolidation. No change. Progressive prominent left lung infiltrate. Heart size stable. Normal pulmonary vascularity. No acute bony abnormality. IMPRESSION: 1. Persistent right upper lung atelectasis/consolidation. No change. 2.  Progressive left lung base infiltrate. Electronically Signed   By: TMarcello Moores Register   On: 04/16/2018 08:52   Dg Chest Portable 1 View  Result Date: 04/15/2018 CLINICAL DATA:  Initial evaluation for acute shortness of breath. EXAM: PORTABLE CHEST 1 VIEW COMPARISON:  None. FINDINGS: Mild cardiomegaly. Mediastinal silhouette within normal limits. Lungs hypoinflated. Dense consolidative opacity present within the right upper lobe, concerning for pneumonia. Associated air bronchograms. DISH in ule patchy left perihilar infiltrate also concerning for infection. Underlying perihilar vascular congestion without overt pulmonary edema. No pleural effusion. No pneumothorax. No acute osseous abnormality. IMPRESSION: Multifocal infiltrates involving the right upper lobe and left perihilar region, concerning for multifocal pneumonia. Electronically  Signed   By: BJeannine BogaM.D.   On: 04/15/2018 05:56    CBC Recent Labs  Lab 04/16/18 0727 04/17/18 0235 04/18/18 0303 04/19/18 03716  04/20/18 0404  WBC 9.1 12.4* 24.3* 35.1* 42.8*  HGB 8.8* 8.5* 8.1* 9.0* 8.1*  HCT 29.0* 27.2* 25.3* 30.2* 26.1*  PLT 216 213 209 257 245  MCV 79.5* 78.2* 76.4* 78.2* 77.0*  MCH 24.1* 24.4* 24.5* 23.3* 23.9*  MCHC 30.3 31.3 32.0 29.8* 31.0  RDW 14.5 13.9 13.7 14.3 14.5  LYMPHSABS 1.9 1.4 3.2 3.9 4.6*  MONOABS 0.8 0.6 1.7* 2.5* 3.2*  EOSABS 0.1 0.2 0.5 0.7* 0.6*  BASOSABS 0.0 0.0 0.0 0.4* 0.0   Chemistries  Recent Labs  Lab 04/15/18 0543  04/16/18 0727 04/17/18 0235 04/18/18 0303 04/19/18 0219 04/20/18 1415  NA 138   < > 135 139 136 137 137  K 3.7   < > 3.7 3.4* 3.1* 4.6 3.0*  CL 102   < > 103 105 107 106 104  CO2 14*   < > 15* 17* 18* 19* 22  GLUCOSE 121*   < > 103* 85 118* 95 122*  BUN 30*   < > 45* 40* 27* 16 7  CREATININE 4.88*   < > 3.65* 2.56* 1.52* 1.45* 1.14*  CALCIUM 8.5*   < > 6.5* 7.2* 7.8* 8.2* 8.4*  AST 49*  --   --   --   --   --   --   ALT 42  --   --   --   --   --   --   ALKPHOS 67  --   --   --   --   --   --   BILITOT 1.3*  --   --   --   --   --   --    < > = values in this interval not displayed.      Component Value Date/Time   BNP 1,013.5 (H) 04/15/2018 8938   Roxan Hockey M.D on 04/20/2018 at 6:29 PM  Pager---[phone] Go to [email or web address] - password [email or web address] for contact info  [facility] - Office  [phone]

## 2018-04-21 DIAGNOSIS — N179 Acute kidney failure, unspecified: Secondary | ICD-10-CM

## 2018-04-21 DIAGNOSIS — F17210 Nicotine dependence, cigarettes, uncomplicated: Secondary | ICD-10-CM

## 2018-04-21 LAB — URINE CULTURE: Special Requests: NORMAL

## 2018-04-21 LAB — HEPATIC FUNCTION PANEL
ALT: 24 U/L (ref 0–44)
AST: 30 U/L (ref 15–41)
Albumin: 2.1 g/dL — ABNORMAL LOW (ref 3.5–5.0)
Alkaline Phosphatase: 114 U/L (ref 38–126)
Bilirubin, Direct: 0.1 mg/dL (ref 0.0–0.2)
Indirect Bilirubin: 0.4 mg/dL (ref 0.3–0.9)
Total Bilirubin: 0.5 mg/dL (ref 0.3–1.2)
Total Protein: 7.1 g/dL (ref 6.5–8.1)

## 2018-04-21 LAB — CBC WITH DIFFERENTIAL/PLATELET
Abs Immature Granulocytes: 0 10*3/uL (ref 0.00–0.07)
Basophils Absolute: 0 10*3/uL (ref 0.0–0.1)
Basophils Relative: 0 %
Eosinophils Absolute: 0.6 10*3/uL — ABNORMAL HIGH (ref 0.0–0.5)
Eosinophils Relative: 2 %
HEMATOCRIT: 24.6 % — AB (ref 36.0–46.0)
Hemoglobin: 7.7 g/dL — ABNORMAL LOW (ref 12.0–15.0)
Lymphocytes Relative: 10 %
Lymphs Abs: 3.2 10*3/uL (ref 0.7–4.0)
MCH: 24.1 pg — ABNORMAL LOW (ref 26.0–34.0)
MCHC: 31.3 g/dL (ref 30.0–36.0)
MCV: 76.9 fL — ABNORMAL LOW (ref 80.0–100.0)
Monocytes Absolute: 1.6 10*3/uL — ABNORMAL HIGH (ref 0.1–1.0)
Monocytes Relative: 5 %
Neutro Abs: 26.5 10*3/uL — ABNORMAL HIGH (ref 1.7–7.7)
Neutrophils Relative %: 83 %
Platelets: 252 10*3/uL (ref 150–400)
RBC: 3.2 MIL/uL — ABNORMAL LOW (ref 3.87–5.11)
RDW: 14.2 % (ref 11.5–15.5)
WBC: 31.9 10*3/uL — ABNORMAL HIGH (ref 4.0–10.5)
nRBC: 0.2 % (ref 0.0–0.2)

## 2018-04-21 LAB — BASIC METABOLIC PANEL
Anion gap: 14 (ref 5–15)
BUN: <5 mg/dL — ABNORMAL LOW (ref 6–20)
CO2: 20 mmol/L — AB (ref 22–32)
Calcium: 8.2 mg/dL — ABNORMAL LOW (ref 8.9–10.3)
Chloride: 102 mmol/L (ref 98–111)
Creatinine, Ser: 1.19 mg/dL — ABNORMAL HIGH (ref 0.44–1.00)
GFR calc Af Amer: >60 mL/min (ref >60)
GFR calc non Af Amer: 60 mL/min — ABNORMAL LOW (ref >60)
GLUCOSE: 126 mg/dL — AB (ref 70–99)
Potassium: 2.9 mmol/L — ABNORMAL LOW (ref 3.5–5.1)
Sodium: 136 mmol/L (ref 135–145)

## 2018-04-21 LAB — HIV ANTIBODY (ROUTINE TESTING W REFLEX): HIV Screen 4th Generation wRfx: NONREACTIVE

## 2018-04-21 LAB — EXPECTORATED SPUTUM ASSESSMENT W GRAM STAIN, RFLX TO RESP C

## 2018-04-21 LAB — C DIFFICILE QUICK SCREEN W PCR REFLEX
C Diff antigen: NEGATIVE
C Diff interpretation: NOT DETECTED
C Diff toxin: NEGATIVE

## 2018-04-21 MED ORDER — IPRATROPIUM-ALBUTEROL 0.5-2.5 (3) MG/3ML IN SOLN
3.0000 mL | Freq: Three times a day (TID) | RESPIRATORY_TRACT | Status: DC
  Administered 2018-04-21 (×2): 3 mL via RESPIRATORY_TRACT
  Filled 2018-04-21: qty 3

## 2018-04-21 MED ORDER — POTASSIUM CHLORIDE CRYS ER 20 MEQ PO TBCR
40.0000 meq | EXTENDED_RELEASE_TABLET | Freq: Once | ORAL | Status: AC
  Administered 2018-04-21: 40 meq via ORAL
  Filled 2018-04-21: qty 2

## 2018-04-21 MED ORDER — IPRATROPIUM-ALBUTEROL 0.5-2.5 (3) MG/3ML IN SOLN
RESPIRATORY_TRACT | Status: AC
  Administered 2018-04-21: 3 mL via RESPIRATORY_TRACT
  Filled 2018-04-21: qty 3

## 2018-04-21 MED ORDER — GUAIFENESIN-CODEINE 100-10 MG/5ML PO SOLN
10.0000 mL | Freq: Once | ORAL | Status: AC
  Administered 2018-04-21: 10 mL via ORAL
  Filled 2018-04-21: qty 10

## 2018-04-21 MED ORDER — FLORANEX PO PACK
1.0000 g | PACK | Freq: Three times a day (TID) | ORAL | Status: DC
  Administered 2018-04-21 – 2018-04-23 (×5): 1 g via ORAL
  Filled 2018-04-21 (×11): qty 1

## 2018-04-21 MED ORDER — WHITE PETROLATUM EX OINT
TOPICAL_OINTMENT | CUTANEOUS | Status: AC
  Administered 2018-04-21: 12:00:00
  Filled 2018-04-21: qty 28.35

## 2018-04-21 MED ORDER — GUAIFENESIN-CODEINE 100-10 MG/5ML PO SOLN
10.0000 mL | Freq: Four times a day (QID) | ORAL | Status: DC | PRN
  Administered 2018-04-21 – 2018-04-23 (×6): 10 mL via ORAL
  Filled 2018-04-21 (×6): qty 10

## 2018-04-21 MED ORDER — ALBUTEROL SULFATE (2.5 MG/3ML) 0.083% IN NEBU
2.5000 mg | INHALATION_SOLUTION | RESPIRATORY_TRACT | Status: DC | PRN
  Administered 2018-04-22: 2.5 mg via RESPIRATORY_TRACT
  Filled 2018-04-21: qty 3

## 2018-04-21 NOTE — Consult Note (Signed)
[facility]       Reason for Consult: pulmonary abscess    Referring Physician: Dr. Mariea Clonts  Principal Problem:   Sepsis  Active Problems:   AKI (acute kidney injury) (HCC)   RUL and LLL Pneumonia   Hypoxic Respiratory failure, acute (HCC)   Lung Abscess/Multi-Focal Bilateral Pneumonia   . white petrolatum      . enoxaparin (LOVENOX) injection  0.5 mg/kg Subcutaneous Daily  . guaiFENesin  600 mg Oral BID  . guaiFENesin-codeine  10 mL Oral Once  . lactobacillus  1 g Oral TID WC & HS  . potassium chloride  40 mEq Oral Once    Recommendations: Amp/sulbactam Monitor WBC Sputum culture   Assessment: She has multifocal pneumonia with an area of cavitary pneumonia/pulmonary abscess.  Interesting to have multiple areas with abscess (though abscess just in one area).  WBC has been very elevated but down some after starting amp/sulbactam.  She was initially quite sick but improved now and breathing on room air.  No Tb risk factors (HIV pending) to consider this and would not be typical either.  Most likely still anaerobic.   Acute renal injury - improved creat. Anemia - unclear etiology.  Previous Hgb in 2018 was normal.  Has trended down here and I suspect it was concentrated initially.  ? Retic, anemia panel?  ? Hemolysis like occurs with mycoplasma.  Also an unusual radiographic finding for mycoplasma.  I have added hepatic panel and haptoglobin.    Antibiotics: Amp/sulbactam day 2 Day 8 total antibiotics  HPI: Hannah Harvey is a 32 y.o. female with no significant medical issues came in 12/2 with cough and severe dyspnea.  Admitted initially to ICU and on bipap and started on treatment for CAP.  Improved and transferred out after 2 days.  Urine antigen positive for Strep.  Now off of oxygen but still with cough.   CT independently reviewed and areas noted.  Seems to be mainly confluent disease.    Review of Systems:  Constitutional: negative  for fevers, chills and anorexia Respiratory: positive for cough, sputum or pleurisy/chest pain, negative for hemoptysis Gastrointestinal: negative for nausea and positive for about 4 loose stools daily Integument/breast: negative for rash All other systems reviewed and are negative    Past Medical History:  Diagnosis Date  . Hypertension     Social History   Tobacco Use  . Smoking status: Current Every Day Smoker    Packs/day: 0.25  . Smokeless tobacco: Never Used  Substance Use Topics  . Alcohol use: Yes    Comment: everyday  . Drug use: No    Family History  Problem Relation Age of Onset  . Hyperlipidemia Mother   . Heart disease Mother   . Hyperlipidemia Father     No Known Allergies  Physical Exam: Constitutional: in no apparent distress  Vitals:   04/21/18 0432 04/21/18 1357  BP: (!) 142/85 (!) 147/82  Pulse: (!) 105 (!) 102  Resp: 19   Temp: 98.9 F (37.2 C) 98.1 F (36.7 C)  SpO2: 97% 100%   EYES: anicteric ENMT:no thrush Cardiovascular: Cor RRR Respiratory: CTA  B;normal respiratory effort GI: soft Musculoskeletal: no edema Skin: no rashes Neuro: non focal  Lab Results  Component Value Date   WBC 31.9 (H) 04/21/2018   HGB 7.7 (L) 04/21/2018   HCT 24.6 (L) 04/21/2018   MCV 76.9 (L) 04/21/2018   PLT 252 04/21/2018    Lab Results  Component  Value Date   CREATININE 1.19 (H) 04/21/2018   BUN <5 (L) 04/21/2018   NA 136 04/21/2018   K 2.9 (L) 04/21/2018   CL 102 04/21/2018   CO2 20 (L) 04/21/2018    Lab Results  Component Value Date   ALT 42 04/15/2018   AST 49 (H) 04/15/2018   ALKPHOS 67 04/15/2018     Microbiology: Recent Results (from the past 240 hour(s))  Blood culture (routine x 2)     Status: None   Collection Time: 04/15/18  5:41 AM  Result Value Ref Range Status   Specimen Description   Final    BLOOD RIGHT ARM Performed at [facility] [place], [address] [place], [place] [address]    Special Requests   Final     BOTTLES DRAWN AEROBIC AND ANAEROBIC Blood Culture adequate volume Performed at [facility] [place], [address] [place], [place] [address]    Culture   Final    NO GROWTH 5 DAYS Performed at [facility], [address] [place], [place] [address]    Report Status 04/20/2018 FINAL  Final  Blood culture (routine x 2)     Status: None   Collection Time: 04/15/18  6:18 AM  Result Value Ref Range Status   Specimen Description   Final    BLOOD LEFT HAND Performed at [facility] [place], [address] [place], [place] [address]    Special Requests   Final    BOTTLES DRAWN AEROBIC AND ANAEROBIC Blood Culture adequate volume Performed at [facility] [place], [address] [place], [place] [address]    Culture   Final    NO GROWTH 5 DAYS Performed at [facility], [address] [place], [place] [address]    Report Status 04/20/2018 FINAL  Final  Respiratory Panel by PCR     Status: None   Collection Time: 04/15/18 11:01 AM  Result Value Ref Range Status   Adenovirus NOT DETECTED NOT DETECTED Final   Coronavirus 229E NOT DETECTED NOT DETECTED Final   Coronavirus HKU1 NOT DETECTED NOT DETECTED Final   Coronavirus NL63 NOT DETECTED NOT DETECTED Final   Coronavirus OC43 NOT DETECTED NOT DETECTED Final   Metapneumovirus NOT DETECTED NOT DETECTED Final   Rhinovirus / Enterovirus NOT DETECTED NOT DETECTED Final   Influenza A NOT DETECTED NOT DETECTED Final   Influenza B NOT DETECTED NOT DETECTED Final   Parainfluenza Virus 1 NOT DETECTED NOT DETECTED Final   Parainfluenza Virus 2 NOT DETECTED NOT DETECTED Final   Parainfluenza Virus 3 NOT DETECTED NOT DETECTED Final   Parainfluenza Virus 4 NOT DETECTED NOT DETECTED Final   Respiratory Syncytial Virus NOT DETECTED NOT DETECTED Final   Bordetella pertussis NOT DETECTED NOT DETECTED Final   Chlamydophila pneumoniae NOT DETECTED NOT DETECTED Final   Mycoplasma pneumoniae NOT DETECTED NOT DETECTED Final     Comment: Performed at [facility], [address] [place], [place] [address]  MRSA PCR Screening     Status: None   Collection Time: 04/15/18 11:01 AM  Result Value Ref Range Status   MRSA by PCR NEGATIVE NEGATIVE Final    Comment:        The GeneXpert MRSA Assay (FDA approved for NASAL specimens only), is one component of a comprehensive MRSA colonization surveillance program. It is not intended to diagnose MRSA infection nor to guide or monitor treatment for MRSA infections. Performed at  [facility], [address] [place], [place] [address]   Culture, blood (Routine X 2) w Reflex to ID Panel     Status: None (Preliminary result)   Collection Time: 04/18/18 12:39 PM  Result Value Ref Range Status   Specimen Description BLOOD RIGHT ANTECUBITAL  Final   Special Requests   Final    BOTTLES DRAWN AEROBIC ONLY Blood Culture adequate volume   Culture   Final    NO GROWTH 2 DAYS Performed at [facility], [address] [place], [place] [address]    Report Status PENDING  Incomplete  Urine Culture     Status: Abnormal   Collection Time: 04/18/18  7:51 PM  Result Value Ref Range Status   Specimen Description URINE, CLEAN CATCH  Final   Special Requests   Final    Normal Performed at [facility], [address] [place], [place] [address]    Culture 20,000 COLONIES/mL ENTEROCOCCUS FAECALIS (A)  Final   Report Status 04/21/2018 FINAL  Final   Organism ID, Bacteria ENTEROCOCCUS FAECALIS (A)  Final      Susceptibility   Enterococcus faecalis - MIC*    AMPICILLIN <=2 SENSITIVE Sensitive     LEVOFLOXACIN 1 SENSITIVE Sensitive     NITROFURANTOIN <=16 SENSITIVE Sensitive     VANCOMYCIN 1 SENSITIVE Sensitive     * 20,000 COLONIES/mL ENTEROCOCCUS FAECALIS  C difficile quick scan w PCR reflex     Status: None   Collection Time: 04/20/18 10:12 PM  Result Value Ref Range Status   C Diff antigen NEGATIVE NEGATIVE Final   C Diff toxin NEGATIVE NEGATIVE Final    C Diff interpretation No C. difficile detected.  Final    Comment: Performed at [facility], [address] [place], [place] [address]    Gardiner Barefoot, MD [facility] for [facility]ious [facility] [email or web address] [phone] pager  [phone] cell 04/21/2018, 2:19 PM

## 2018-04-21 NOTE — Progress Notes (Signed)
Patient Demographics:    Hannah Harvey, is a 32 y.o. female, DOB - [birth year], JGG:[id number]  Admit date - 04/15/2018   Admitting Physician Frederik Pear, MD  Outpatient Primary MD for the patient is Gregor Hams, FNP  LOS - 6   Chief Complaint  Patient presents with  . Shortness of Breath        Subjective:    Hannah Harvey today has no fevers, no emesis, diarrhea persist, C. difficile negative, cough, shortness of breath and wheezing persist.   Assessment  & Plan :    Principal Problem:   Sepsis  Active Problems:   RUL and LLL Pneumonia   Hypoxic Respiratory failure, acute (HCC)   AKI (acute kidney injury) ([place])   Lung Abscess/Multi-Focal Bilateral Pneumonia  Brief Summary 32 year old previously healthy female admitted on 04/15/2018 with sepsis secondary to pneumonia, patient was found to have acute hypoxic respiratory failure as well as acute kidney injury in the setting of sepsis  CT chest w/contrast 04/20/18 IMPRESSION: Multifocal pneumonia, as described above, with cavitation/abscess in the posterior right upper lobe.   Plan:-  1)Sepsis--- POA----secondary to bilateral strep pneumonia with lung abscess and cavitation Legionella test negative, respiratory panel negative, white count is down to 31.9 from  42.8,  due to lung abscess/cavitary lesion, Previously discussed with infectious disease consultant Dr. Linus Salmons, CT chest with contrast to rule out lung abscess done on 04/20/2018 consistent with cavitary lesion/abscess in the lungs, c/n Iv Unasyn (04/20/18) for anaerobic coverage, STOPped  azithromycin and stopped Rocephin on 04/20/2018 ,  patient met sepsis criteria on admission with tachycardia, tachypnea, hypoxia, AKI reflecting end-organ damage and lactic acidosis reflecting poor tissue perfusion---  .. c/n  IV fluid rate due to tachycardia and concerns about AKI with  contrast study  2)Bilateral Strep Pneumonia/Lung Abscess/Multi-Focal Bilateral Pneumonia------ patient with right upper lobe and left lower lobe pneumonia with hypoxia, white count is down to 31.9 from  42.8 due to lung abscess/cavitary lesion treat as above #1   3)Acute Hypoxic Respiratory Failure--- secondary to #1 and #2 above treat as above 1 and #2 , no longer requiring BiPAP, prior to admission patient was previously healthy,  no underlying lung disease., no history of tobacco use, mostly using oxygen now with minimum activity at rest able to come off O2  4)AKI----acute kidney injury -due to sepsis with decreased renal perfusion, AKI is resolved, however giving contrast study on 04/20/2018 hydrate and watch renal function closely creatinine on admission= 4.88 ,   baseline creatinine =0.78    , creatinine is now= 1.1 , renally adjust medications, avoid nephrotoxic agents / dehydration /hypotension, renal function continues to improve with hydration, patient is voiding well, watch renal function closely due to contrast study on 04/20/2018  5) acute anemia--- query due to sepsis compounded by hemodilution, per patient no prior history of anemia, admission hemoglobin was 11.5, hemoglobin now down to 7.7, no evidence of GI blood loss, monitor closely and transfuse as clinically indicated   Disposition/Need for in-Hospital Stay- patient unable to be discharged at this time due to acute hypoxic respiratory failure secondary to pneumonia/lung abscess and sepsis requiring IV Unasyn and supplemental oxygen,   dyspnea on exertion remains significant, hypoxia noted  Code Status :  full  Disposition Plan  : TBD  Consults  :  PCCM/c  ID physician Dr. Linus Salmons DVT Prophylaxis  :  Lovenox -   Lab Results  Component Value Date   PLT 252 04/21/2018   Inpatient Medications  Scheduled Meds: . white petrolatum      . enoxaparin (LOVENOX) injection  0.5 mg/kg Subcutaneous Daily  . guaiFENesin  600 mg Oral BID   . guaiFENesin-codeine  10 mL Oral Once  . lactobacillus  1 g Oral TID WC & HS  . potassium chloride  40 mEq Oral Once  . potassium chloride  40 mEq Oral Once   Continuous Infusions: . 0.9 % NaCl with KCl 20 mEq / L 100 mL/hr at 04/21/18 0414  . ampicillin-sulbactam (UNASYN) IV 3 g (04/21/18 0551)   PRN Meds:.[COMPLETED] acetaminophen **FOLLOWED BY** acetaminophen, fentaNYL (SUBLIMAZE) injection, guaiFENesin-codeine, guaiFENesin-dextromethorphan, menthol-cetylpyridinium, zolpidem    Anti-infectives (From admission, onward)   Start     Dose/Rate Route Frequency Ordered Stop   04/20/18 1830  Ampicillin-Sulbactam (UNASYN) 3 g in sodium chloride 0.9 % 100 mL IVPB     3 g 200 mL/hr over 30 Minutes Intravenous Every 6 hours 04/20/18 1819     04/18/18 1600  azithromycin (ZITHROMAX) 500 mg in sodium chloride 0.9 % 250 mL IVPB  Status:  Discontinued     500 mg 250 mL/hr over 60 Minutes Intravenous Daily 04/18/18 1548 04/20/18 1819   04/15/18 0630  oseltamivir (TAMIFLU) capsule 75 mg  Status:  Discontinued     75 mg Oral  Once 04/15/18 0626 04/16/18 0915   04/15/18 0615  cefTRIAXone (ROCEPHIN) 2 g in sodium chloride 0.9 % 100 mL IVPB  Status:  Discontinued     2 g 200 mL/hr over 30 Minutes Intravenous Every 24 hours 04/15/18 0607 04/20/18 1819   04/15/18 0615  azithromycin (ZITHROMAX) 500 mg in sodium chloride 0.9 % 250 mL IVPB  Status:  Discontinued     500 mg 250 mL/hr over 60 Minutes Intravenous Every 24 hours 04/15/18 0607 04/16/18 0915   04/15/18 0609  azithromycin (ZITHROMAX) 500 MG injection    Note to Pharmacy:  Karl Ito   : cabinet override      04/15/18 0609 04/15/18 0618        Objective:   Vitals:   04/20/18 1122 04/20/18 1347 04/20/18 2023 04/21/18 0432  BP:  137/83 (!) 136/94 (!) 142/85  Pulse:  (!) 112 (!) 114 (!) 105  Resp:  (!) _0 Temp: 98.7 F (37.1 C) 98.3 F (36.8 C) 99 F (37.2 C) 98.9 F (37.2 C)  TempSrc: Oral Oral Oral Oral  SpO2:  94% 95% 97%   Weight:      Height:        Wt Readings from Last 3 Encounters:  04/15/18 (!) 147 kg  03/04/18 (!) 137 kg  12/06/17 (!) 140.6 kg    Intake/Output Summary (Last 24 hours) at 04/21/2018 1235 Last data filed at 04/21/2018 0945 Gross per 24 hour  Intake 2981.58 ml  Output -  Net 2981.58 ml    Physical Exam Patient is examined daily including today on 04/21/18 , exams remain the same as of yesterday except that has changed   Gen:- Awake Alert, able to speak in short sentences HEENT:- [place].AT, No sclera icterus Nose- [place] 1 L/min Neck-Supple Neck,No JVD,.  Lungs-diminished breath sounds bilaterally with bilateral rhonchi, slight wheezing CV- S1, S2 normal, regular  Abd-  +ve B.Sounds, Abd Soft, No tenderness,  Extremity/Skin:- No  edema, pedal pulses present  Psych-affect is appropriate, oriented x3 Neuro-no new focal deficits, no tremors   Data Review:   Micro Results Recent Results (from the past 240 hour(s))  Blood culture (routine x 2)     Status: None   Collection Time: 04/15/18  5:41 AM  Result Value Ref Range Status   Specimen Description   Final    BLOOD RIGHT ARM Performed at [facility], [address] [place], [place] [address]    Special Requests   Final    BOTTLES DRAWN AEROBIC AND ANAEROBIC Blood Culture adequate volume Performed at [facility], [address] [place], [place] [address]    Culture   Final    NO GROWTH 5 DAYS Performed at [facility], [place] [address] [place], [place] [address]    Report Status 04/20/2018 FINAL  Final  Blood culture (routine x 2)     Status: None   Collection Time: 04/15/18  6:18 AM  Result Value Ref Range Status   Specimen Description   Final    BLOOD LEFT HAND Performed at [facility], [address] [place], [place] [address]    Special Requests   Final    BOTTLES DRAWN AEROBIC AND ANAEROBIC Blood Culture adequate volume Performed at [facility], [address] [place], [place] [address]    Culture   Final    NO GROWTH 5 DAYS Performed at [facility], [place] [address] [place], [place] [address]    Report Status 04/20/2018 FINAL  Final  Respiratory Panel by PCR     Status: None   Collection Time: 04/15/18 11:01 AM  Result Value Ref Range Status   Adenovirus NOT DETECTED NOT DETECTED Final   Coronavirus 229E NOT DETECTED NOT DETECTED Final   Coronavirus HKU1 NOT DETECTED NOT DETECTED Final   Coronavirus NL63 NOT DETECTED NOT DETECTED Final   Coronavirus OC43 NOT DETECTED NOT DETECTED Final   Metapneumovirus NOT DETECTED NOT DETECTED Final   Rhinovirus / Enterovirus NOT DETECTED NOT DETECTED Final   Influenza A NOT DETECTED NOT DETECTED Final   Influenza B NOT DETECTED NOT DETECTED Final   Parainfluenza Virus 1 NOT DETECTED NOT DETECTED Final   Parainfluenza Virus 2 NOT DETECTED NOT DETECTED Final   Parainfluenza Virus 3 NOT DETECTED NOT DETECTED Final   Parainfluenza Virus 4 NOT DETECTED NOT DETECTED Final   Respiratory Syncytial Virus NOT DETECTED NOT DETECTED Final   Bordetella pertussis NOT DETECTED NOT DETECTED Final   Chlamydophila pneumoniae NOT DETECTED NOT DETECTED Final   Mycoplasma pneumoniae NOT DETECTED NOT DETECTED Final    Comment: Performed at [facility], [place] [address] [place], [place] [address]  MRSA PCR Screening     Status: None   Collection Time: 04/15/18 11:01 AM  Result Value Ref Range Status   MRSA by PCR NEGATIVE NEGATIVE Final    Comment:        The GeneXpert MRSA Assay (FDA approved for NASAL specimens only), is one component of a comprehensive MRSA colonization surveillance program. It is not intended to diagnose MRSA infection nor to guide or monitor treatment for MRSA infections. Performed at [facility], [place] [address] [place], [place] [address]   Culture, blood (Routine X 2) w Reflex to ID Panel     Status: None (Preliminary result)   Collection Time: 04/18/18 12:39 PM  Result Value Ref Range Status   Specimen Description BLOOD RIGHT ANTECUBITAL  Final   Special Requests   Final    BOTTLES DRAWN AEROBIC ONLY Blood Culture adequate volume   Culture   Final    NO GROWTH 2 DAYS Performed at [facility], [address], [place], [place] [address]    Report Status PENDING  Incomplete  Urine Culture     Status: Abnormal   Collection Time: 04/18/18  7:51 PM  Result Value Ref Range Status   Specimen Description URINE, CLEAN CATCH  Final   Special Requests   Final    Normal Performed at [facility], [place] [address], [place], [place] [address]    Culture 20,000 COLONIES/mL ENTEROCOCCUS FAECALIS (A)  Final   Report Status 04/21/2018 FINAL  Final   Organism ID, Bacteria ENTEROCOCCUS FAECALIS (A)  Final      Susceptibility   Enterococcus faecalis - MIC*    AMPICILLIN <=2 SENSITIVE Sensitive     LEVOFLOXACIN 1 SENSITIVE Sensitive     NITROFURANTOIN <=16 SENSITIVE Sensitive     VANCOMYCIN 1 SENSITIVE Sensitive     * 20,000 COLONIES/mL ENTEROCOCCUS FAECALIS  C difficile quick scan w PCR reflex     Status: None   Collection Time: 04/20/18 10:12 PM  Result Value Ref Range Status   C Diff antigen NEGATIVE NEGATIVE Final   C Diff toxin NEGATIVE NEGATIVE Final   C Diff interpretation No C. difficile detected.  Final    Comment: Performed at [facility], [place] [address], [place], [place] [address]    Radiology Reports Dg Chest 2 View  Result Date: 04/19/2018 CLINICAL DATA:  Shortness of breath for 1 week EXAM: CHEST - 2 VIEW COMPARISON:  04/16/2018 FINDINGS: Cardiac shadow is enlarged but stable. Some improved aeration in the right upper lobe is noted with near complete resolution of previously seen consolidation. Patchy infiltrate is again noted in the left mid and lower lung. Generalized central perihilar markings are noted most consistent with congestive failure and edema. IMPRESSION: Improved aeration in the right upper lobe although  persistent infiltrate is seen. Persistent left basilar infiltrate is noted. New increased perihilar markings consistent with edema. Electronically Signed   By: Inez Catalina M.D.   On: 04/19/2018 06:27   Ct Chest W Contrast  Result Date: 04/20/2018 CLINICAL DATA:  Cough, pneumonia, leukocytosis EXAM: CT CHEST WITH CONTRAST TECHNIQUE: Multidetector CT imaging of the chest was performed during intravenous contrast administration. CONTRAST:  9m OMNIPAQUE IOHEXOL 300 MG/ML  SOLN COMPARISON:  Chest radiograph dated 04/19/2018 FINDINGS: Cardiovascular: The heart is top-normal in size. No pericardial effusion. No evidence of thoracic aortic aneurysm. Mediastinum/Nodes: Small mediastinal lymph nodes. Visualized thyroid is grossly unremarkable. Lungs/Pleura: Right upper lobe opacity/consolidation with air bronchograms, suggesting pneumonia. Associated cavitation in the posterior aspect of the right upper lobe (series 4/image 48), suggesting cavitary pneumonia/pulmonary abscess. Additional ground-glass opacity with air bronchograms in the right middle lobe, left upper lobe/lingula, and anterior left lower lobe. Mild patchy/nodular opacity in the right lower lobe. This appearance is compatible with multifocal pneumonia. No definite pleural effusions to suggest superimposed interstitial edema. No pneumothorax. Upper Abdomen: Visualized appear upper abdomen is notable for hepatomegaly with suspected multifocal hepatic cysts, although poorly evaluated. Musculoskeletal: Visualized osseous structures are within normal limits. IMPRESSION: Multifocal pneumonia, as described above, with cavitation/abscess in the posterior right upper lobe. Electronically Signed   By: SJulian HyM.D.   On: 04/20/2018 18:06  Dg Chest Port 1 View  Result Date: 04/16/2018 CLINICAL DATA:  Chest tightness.  Shortness of breath. EXAM: PORTABLE CHEST 1 VIEW COMPARISON:  04/15/2018. FINDINGS: Persistent right upper lung  atelectasis/consolidation. No change. Progressive prominent left lung infiltrate. Heart size stable. Normal pulmonary vascularity. No acute bony abnormality. IMPRESSION: 1. Persistent right upper lung atelectasis/consolidation. No change. 2.  Progressive left lung base infiltrate. Electronically Signed   By: Marcello Moores  Register   On: 04/16/2018 08:52   Dg Chest Portable 1 View  Result Date: 04/15/2018 CLINICAL DATA:  Initial evaluation for acute shortness of breath. EXAM: PORTABLE CHEST 1 VIEW COMPARISON:  None. FINDINGS: Mild cardiomegaly. Mediastinal silhouette within normal limits. Lungs hypoinflated. Dense consolidative opacity present within the right upper lobe, concerning for pneumonia. Associated air bronchograms. DISH in ule patchy left perihilar infiltrate also concerning for infection. Underlying perihilar vascular congestion without overt pulmonary edema. No pleural effusion. No pneumothorax. No acute osseous abnormality. IMPRESSION: Multifocal infiltrates involving the right upper lobe and left perihilar region, concerning for multifocal pneumonia. Electronically Signed   By: Jeannine Boga M.D.   On: 04/15/2018 05:56    CBC Recent Labs  Lab 04/17/18 0235 04/18/18 0303 04/19/18 0219 04/20/18 0404 04/21/18 0252  WBC 12.4* 24.3* 35.1* 42.8* 31.9*  HGB 8.5* 8.1* 9.0* 8.1* 7.7*  HCT 27.2* 25.3* 30.2* 26.1* 24.6*  PLT 213 209 257 245 252  MCV 78.2* 76.4* 78.2* 77.0* 76.9*  MCH 24.4* 24.5* 23.3* 23.9* 24.1*  MCHC 31.3 32.0 29.8* 31.0 31.3  RDW 13.9 13.7 14.3 14.5 14.2  LYMPHSABS 1.4 3.2 3.9 4.6* 3.2  MONOABS 0.6 1.7* 2.5* 3.2* 1.6*  EOSABS 0.2 0.5 0.7* 0.6* 0.6*  BASOSABS 0.0 0.0 0.4* 0.0 0.0   Chemistries  Recent Labs  Lab 04/15/18 0543  04/17/18 0235 04/18/18 0303 04/19/18 0219 04/20/18 1415 04/21/18 0252  NA 138   < > 139 136 137 137 136  K 3.7   < > 3.4* 3.1* 4.6 3.0* 2.9*  CL 102   < > 105 107 106 104 102  CO2 14*   < > 17* 18* 19* 22 20*  GLUCOSE 121*   < > 85  118* 95 122* 126*  BUN 30*   < > 40* 27* 16 7 <5*  CREATININE 4.88*   < > 2.56* 1.52* 1.45* 1.14* 1.19*  CALCIUM 8.5*   < > 7.2* 7.8* 8.2* 8.4* 8.2*  AST 49*  --   --   --   --   --   --   ALT 42  --   --   --   --   --   --   ALKPHOS 67  --   --   --   --   --   --   BILITOT 1.3*  --   --   --   --   --   --    < > = values in this interval not displayed.      Component Value Date/Time   BNP 1,013.5 (H) 04/15/2018 2035   Roxan Hockey M.D on 04/21/2018 at 12:35 PM  Pager---[phone] Go to [email or web address] - password [email or web address] for contact info  [facility] - Office  [phone]

## 2018-04-22 DIAGNOSIS — D72829 Elevated white blood cell count, unspecified: Secondary | ICD-10-CM

## 2018-04-22 DIAGNOSIS — E8770 Fluid overload, unspecified: Secondary | ICD-10-CM

## 2018-04-22 DIAGNOSIS — D649 Anemia, unspecified: Secondary | ICD-10-CM

## 2018-04-22 DIAGNOSIS — J851 Abscess of lung with pneumonia: Secondary | ICD-10-CM

## 2018-04-22 LAB — CBC WITH DIFFERENTIAL/PLATELET
Band Neutrophils: 0 %
Basophils Absolute: 0 10*3/uL (ref 0.0–0.1)
Basophils Relative: 0 %
Blasts: 0 %
Eosinophils Absolute: 0.5 10*3/uL (ref 0.0–0.5)
Eosinophils Relative: 2 %
HCT: 21.5 % — ABNORMAL LOW (ref 36.0–46.0)
HEMOGLOBIN: 6.4 g/dL — AB (ref 12.0–15.0)
LYMPHS PCT: 17 %
Lymphs Abs: 3.9 10*3/uL (ref 0.7–4.0)
MCH: 23.4 pg — AB (ref 26.0–34.0)
MCHC: 29.8 g/dL — ABNORMAL LOW (ref 30.0–36.0)
MCV: 78.5 fL — ABNORMAL LOW (ref 80.0–100.0)
Metamyelocytes Relative: 0 %
Monocytes Absolute: 1.8 10*3/uL — ABNORMAL HIGH (ref 0.1–1.0)
Monocytes Relative: 8 %
Myelocytes: 0 %
Neutro Abs: 16.6 10*3/uL — ABNORMAL HIGH (ref 1.7–7.7)
Neutrophils Relative %: 73 %
OTHER: 0 %
PLATELETS: 295 10*3/uL (ref 150–400)
Promyelocytes Relative: 0 %
RBC: 2.74 MIL/uL — ABNORMAL LOW (ref 3.87–5.11)
RDW: 14.3 % (ref 11.5–15.5)
WBC: 22.8 10*3/uL — ABNORMAL HIGH (ref 4.0–10.5)
nRBC: 0 /100 WBC
nRBC: 0.2 % (ref 0.0–0.2)

## 2018-04-22 LAB — BASIC METABOLIC PANEL
Anion gap: 8 (ref 5–15)
Anion gap: 13 (ref 5–15)
BUN: <5 mg/dL — ABNORMAL LOW (ref 6–20)
BUN: <5 mg/dL — ABNORMAL LOW (ref 6–20)
CO2: 24 mmol/L (ref 22–32)
CO2: 22 mmol/L (ref 22–32)
CREATININE: 1.06 mg/dL — AB (ref 0.44–1.00)
Calcium: 7.9 mg/dL — ABNORMAL LOW (ref 8.9–10.3)
Calcium: 8.1 mg/dL — ABNORMAL LOW (ref 8.9–10.3)
Chloride: 106 mmol/L (ref 98–111)
Chloride: 104 mmol/L (ref 98–111)
Creatinine, Ser: 1.02 mg/dL — ABNORMAL HIGH (ref 0.44–1.00)
GFR calc Af Amer: >60 mL/min (ref >60)
GFR calc Af Amer: >60 mL/min (ref >60)
GFR calc non Af Amer: >60 mL/min (ref >60)
Glucose, Bld: 101 mg/dL — ABNORMAL HIGH (ref 70–99)
Glucose, Bld: 120 mg/dL — ABNORMAL HIGH (ref 70–99)
Potassium: 3.6 mmol/L (ref 3.5–5.1)
Potassium: 3.2 mmol/L — ABNORMAL LOW (ref 3.5–5.1)
SODIUM: 138 mmol/L (ref 135–145)
Sodium: 139 mmol/L (ref 135–145)

## 2018-04-22 LAB — ABO/RH: ABO/RH(D): O POS

## 2018-04-22 LAB — PREPARE RBC (CROSSMATCH)

## 2018-04-22 LAB — HAPTOGLOBIN: Haptoglobin: 627 mg/dL — ABNORMAL HIGH (ref 34–200)

## 2018-04-22 MED ORDER — GUAIFENESIN-CODEINE 100-10 MG/5ML PO SOLN
10.0000 mL | Freq: Once | ORAL | Status: AC
  Administered 2018-04-22: 10 mL via ORAL
  Filled 2018-04-22: qty 10

## 2018-04-22 MED ORDER — OXYCODONE HCL 5 MG PO TABS
5.0000 mg | ORAL_TABLET | Freq: Three times a day (TID) | ORAL | Status: DC | PRN
  Administered 2018-04-22 – 2018-04-23 (×4): 5 mg via ORAL
  Filled 2018-04-22 (×4): qty 1

## 2018-04-22 MED ORDER — POTASSIUM CHLORIDE CRYS ER 20 MEQ PO TBCR
40.0000 meq | EXTENDED_RELEASE_TABLET | Freq: Once | ORAL | Status: AC
  Administered 2018-04-22: 40 meq via ORAL
  Filled 2018-04-22: qty 2

## 2018-04-22 MED ORDER — SODIUM CHLORIDE 0.9% IV SOLUTION
Freq: Once | INTRAVENOUS | Status: AC
  Administered 2018-04-22: 06:00:00 via INTRAVENOUS

## 2018-04-22 MED ORDER — ENOXAPARIN SODIUM 40 MG/0.4ML ~~LOC~~ SOLN
40.0000 mg | SUBCUTANEOUS | Status: DC
  Administered 2018-04-23: 40 mg via SUBCUTANEOUS
  Filled 2018-04-22: qty 0.4

## 2018-04-22 NOTE — Progress Notes (Addendum)
Received orders for Type and Screen and 1 Unit PRBC transfusion.  Patient  was made aware accordingly and Phlebotomist called for Type and Screen.  Will monitor.

## 2018-04-22 NOTE — Progress Notes (Addendum)
CRITICAL VALUE ALERT  Critical Value:  Hgb=6.4  No signs of GI bleed, pt. not in distress  Date & Time Notied:  04/22/2018; 0335  Provider Notified: Dr. Rana SnareBodenheimer - [facility]  Orders Received/Actions taken: Awaiting further orders

## 2018-04-22 NOTE — Plan of Care (Signed)
  Problem: Clinical Measurements: Goal: Ability to maintain clinical measurements within normal limits will improve Outcome: Progressing Goal: Respiratory complications will improve Outcome: Progressing   

## 2018-04-22 NOTE — Progress Notes (Signed)
Patient Demographics:    Hannah Harvey, is a 32 y.o. female, DOB - 04-24-1986, SLH:[id number]  Admit date - 04/15/2018   Admitting Physician Frederik Pear, MD  Outpatient Primary MD for the patient is Gregor Hams, FNP  LOS - 7   Chief Complaint  Patient presents with  . Shortness of Breath        Subjective:    Hannah Harvey today has no fevers, no emesis, diarrhea persist, C. difficile negative, cough, shortness of breath and wheezing persist.   Assessment  & Plan :    Principal Problem:   Sepsis  Active Problems:   RUL and LLL Pneumonia   Hypoxic Respiratory failure, acute (HCC)   AKI (acute kidney injury) ([place])   Lung Abscess/Multi-Focal Bilateral Pneumonia  Brief Summary 32 year old previously healthy female admitted on 04/15/2018 with sepsis secondary to pneumonia, patient was found to have acute hypoxic respiratory failure as well as acute kidney injury in the setting of sepsis  CT chest w/contrast 04/20/18 IMPRESSION: Multifocal pneumonia, as described above, with cavitation/abscess in the posterior right upper lobe.   Plan:-  1)Sepsis--- POA----secondary to bilateral strep pneumonia with lung abscess and cavitation Legionella test negative, respiratory panel negative, white count is down to 22k from  42.8 k,  due to lung abscess/cavitary lesion, Previously discussed with infectious disease consultant Dr. Linus Salmons, CT chest with contrast to rule out lung abscess done on 04/20/2018 consistent with cavitary lesion/abscess in the lungs, c/n Iv Unasyn (04/20/18) for anaerobic coverage, STOPped  azithromycin and stopped Rocephin on 04/20/2018 ,  patient met sepsis criteria on admission with tachycardia, tachypnea, hypoxia, AKI reflecting end-organ damage and lactic acidosis reflecting poor tissue perfusion---  .. c/n  IV fluid rate due to tachycardia and concerns about AKI with  contrast study.  ID recommends continuing IV antibiotics through 05/04/2018 to complete 14 days course  2)Bilateral Strep Pneumonia/Lung Abscess/Multi-Focal Bilateral Pneumonia------ patient with right upper lobe and left lower lobe pneumonia with hypoxia, white count is down to 22k from  42.8 due to lung abscess/cavitary lesion treat as above #1   3)Acute Hypoxic Respiratory Failure--- secondary to #1 and #2 above treat as above 1 and #2 , no longer requiring BiPAP, prior to admission patient was previously healthy,  no underlying lung disease., no history of tobacco use, mostly using oxygen now with minimum activity at rest able to come off O2  4)AKI----acute kidney injury -due to sepsis with decreased renal perfusion, AKI is resolved, however giving contrast study on 04/20/2018 hydrate and watch renal function closely creatinine on admission= 4.88 ,   baseline creatinine =0.78    , creatinine is now= 1.0 , renally adjust medications, avoid nephrotoxic agents / dehydration /hypotension, renal function continues to improve with hydration, patient is voiding well, watch renal function closely due to contrast study on 04/20/2018  5) acute anemia--- query due to sepsis compounded by hemodilution, per patient no prior history of anemia, admission hemoglobin was 11.5, hemoglobin now down to 6.4, no evidence of GI blood loss, monitor closely and transfuse as clinically indicated, LFTs on haptoglobin are not elevated, transfuse 1 units of packed cells to keep hemoglobin above 7, check stool for occult blood, reduce Lovenox 40 mg subcu daily for DVT prophylaxis,  platelets are not low  6) enterococcus UTI--- patient is largely asymptomatic, Unasyn prescribed above for lung abscess should cover adequately for this,   Disposition/Need for in-Hospital Stay- patient unable to be discharged at this time due to acute hypoxic respiratory failure secondary to pneumonia/lung abscess and sepsis requiring IV Unasyn and  supplemental oxygen,   dyspnea on exertion remains significant, hypoxia noted  Code Status : full  Disposition Plan  : TBD  Consults  :  PCCM/c  ID physician Dr. Linus Salmons DVT Prophylaxis  :  Lovenox -   Lab Results  Component Value Date   PLT 295 04/22/2018   Inpatient Medications  Scheduled Meds: . [START ON 04/23/2018] enoxaparin (LOVENOX) injection  40 mg Subcutaneous Q24H  . guaiFENesin  600 mg Oral BID  . guaiFENesin-codeine  10 mL Oral Once  . lactobacillus  1 g Oral TID WC & HS   Continuous Infusions: . 0.9 % NaCl with KCl 20 mEq / L 50 mL/hr at 04/22/18 1603  . ampicillin-sulbactam (UNASYN) IV Stopped (04/22/18 1159)   PRN Meds:.[COMPLETED] acetaminophen **FOLLOWED BY** acetaminophen, albuterol, fentaNYL (SUBLIMAZE) injection, guaiFENesin-codeine, menthol-cetylpyridinium, oxyCODONE, zolpidem    Anti-infectives (From admission, onward)   Start     Dose/Rate Route Frequency Ordered Stop   04/20/18 1830  Ampicillin-Sulbactam (UNASYN) 3 g in sodium chloride 0.9 % 100 mL IVPB     3 g 200 mL/hr over 30 Minutes Intravenous Every 6 hours 04/20/18 1819     04/18/18 1600  azithromycin (ZITHROMAX) 500 mg in sodium chloride 0.9 % 250 mL IVPB  Status:  Discontinued     500 mg 250 mL/hr over 60 Minutes Intravenous Daily 04/18/18 1548 04/20/18 1819   04/15/18 0630  oseltamivir (TAMIFLU) capsule 75 mg  Status:  Discontinued     75 mg Oral  Once 04/15/18 0626 04/16/18 0915   04/15/18 0615  cefTRIAXone (ROCEPHIN) 2 g in sodium chloride 0.9 % 100 mL IVPB  Status:  Discontinued     2 g 200 mL/hr over 30 Minutes Intravenous Every 24 hours 04/15/18 0607 04/20/18 1819   04/15/18 0615  azithromycin (ZITHROMAX) 500 mg in sodium chloride 0.9 % 250 mL IVPB  Status:  Discontinued     500 mg 250 mL/hr over 60 Minutes Intravenous Every 24 hours 04/15/18 0607 04/16/18 0915   04/15/18 0609  azithromycin (ZITHROMAX) 500 MG injection    Note to Pharmacy:  Karl Ito   : cabinet override       04/15/18 0609 04/15/18 0618        Objective:   Vitals:   04/22/18 0720 04/22/18 0946 04/22/18 1323 04/22/18 1621  BP:  (!) 134/92 (!) 151/91   Pulse:  90 90   Resp:  18 (!) 22   Temp: 98.5 F (36.9 C) 98.4 F (36.9 C) 98 F (36.7 C)   TempSrc: Oral Oral Oral   SpO2:   100% 94%  Weight:      Height:        Wt Readings from Last 3 Encounters:  04/15/18 (!) 147 kg  03/04/18 (!) 137 kg  12/06/17 (!) 140.6 kg    Intake/Output Summary (Last 24 hours) at 04/22/2018 1624 Last data filed at 04/22/2018 1603 Gross per 24 hour  Intake 4883.14 ml  Output -  Net 4883.14 ml    Physical Exam Patient is examined daily including today on 04/22/18 , exams remain the same as of yesterday except that has changed   Gen:- Awake Alert, able to speak  in short sentences HEENT:- [place].AT, No sclera icterus Nose- [place] 1 L/min Neck-Supple Neck,No JVD,.  Lungs-diminished breath sounds bilaterally with bilateral rhonchi, No wheezing CV- S1, S2 normal, regular  Abd-  +ve B.Sounds, Abd Soft, No tenderness, increased truncal adiposity    Extremity/Skin:- No  edema, pedal pulses present  Psych-affect is appropriate, oriented x3 Neuro-no new focal deficits, no tremors   Data Review:   Micro Results Recent Results (from the past 240 hour(s))  Blood culture (routine x 2)     Status: None   Collection Time: 04/15/18  5:41 AM  Result Value Ref Range Status   Specimen Description   Final    BLOOD RIGHT ARM Performed at [facility], [address] [place], [place] [address]    Special Requests   Final    BOTTLES DRAWN AEROBIC AND ANAEROBIC Blood Culture adequate volume Performed at [facility], [address] [place], [place] [address]    Culture   Final    NO GROWTH 5 DAYS Performed at [facility], [place] [address], [place], [place] [address]    Report Status 04/20/2018 FINAL  Final  Blood culture (routine x 2)     Status: None   Collection Time: 04/15/18  6:18  AM  Result Value Ref Range Status   Specimen Description   Final    BLOOD LEFT HAND Performed at [facility], [address] [place], [place] [address]    Special Requests   Final    BOTTLES DRAWN AEROBIC AND ANAEROBIC Blood Culture adequate volume Performed at [facility], [address] [place], [place] [address]    Culture   Final    NO GROWTH 5 DAYS Performed at [facility], [place] [address], [place], [place] [address]    Report Status 04/20/2018 FINAL  Final  Respiratory Panel by PCR     Status: None   Collection Time: 04/15/18 11:01 AM  Result Value Ref Range Status   Adenovirus NOT DETECTED NOT DETECTED Final   Coronavirus 229E NOT DETECTED NOT DETECTED Final   Coronavirus HKU1 NOT DETECTED NOT DETECTED Final   Coronavirus NL63 NOT DETECTED NOT DETECTED Final   Coronavirus OC43 NOT DETECTED NOT DETECTED Final   Metapneumovirus NOT DETECTED NOT DETECTED Final   Rhinovirus / Enterovirus NOT DETECTED NOT DETECTED Final   Influenza A NOT DETECTED NOT DETECTED Final   Influenza B NOT DETECTED NOT DETECTED Final   Parainfluenza Virus 1 NOT DETECTED NOT DETECTED Final   Parainfluenza Virus 2 NOT DETECTED NOT DETECTED Final   Parainfluenza Virus 3 NOT DETECTED NOT DETECTED Final   Parainfluenza Virus 4 NOT DETECTED NOT DETECTED Final   Respiratory Syncytial Virus NOT DETECTED NOT DETECTED Final   Bordetella pertussis NOT DETECTED NOT DETECTED Final   Chlamydophila pneumoniae NOT DETECTED NOT DETECTED Final   Mycoplasma pneumoniae NOT DETECTED NOT DETECTED Final    Comment: Performed at [facility], [place] [address] [place],  [address]  MRSA PCR Screening     Status: None   Collection Time: 04/15/18 11:01 AM  Result Value Ref Range Status   MRSA by PCR NEGATIVE NEGATIVE Final    Comment:        The GeneXpert MRSA Assay (FDA approved for NASAL specimens only), is one component of a comprehensive MRSA colonization surveillance  program. It is not intended to diagnose MRSA infection nor to guide or monitor  treatment for MRSA infections. Performed at [facility], [place] [address], [place], [place] [address]   Culture, blood (Routine X 2) w Reflex to ID Panel     Status: None (Preliminary result)   Collection Time: 04/18/18 12:39 PM  Result Value Ref Range Status   Specimen Description BLOOD RIGHT ANTECUBITAL  Final   Special Requests   Final    BOTTLES DRAWN AEROBIC ONLY Blood Culture adequate volume   Culture   Final    NO GROWTH 4 DAYS Performed at [facility], [place] [address] [place], [place] [address]    Report Status PENDING  Incomplete  Urine Culture     Status: Abnormal   Collection Time: 04/18/18  7:51 PM  Result Value Ref Range Status   Specimen Description URINE, CLEAN CATCH  Final   Special Requests   Final    Normal Performed at [facility], [place] [address] [place], [place] [address]    Culture 20,000 COLONIES/mL ENTEROCOCCUS FAECALIS (A)  Final   Report Status 04/21/2018 FINAL  Final   Organism ID, Bacteria ENTEROCOCCUS FAECALIS (A)  Final      Susceptibility   Enterococcus faecalis - MIC*    AMPICILLIN <=2 SENSITIVE Sensitive     LEVOFLOXACIN 1 SENSITIVE Sensitive     NITROFURANTOIN <=16 SENSITIVE Sensitive     VANCOMYCIN 1 SENSITIVE Sensitive     * 20,000 COLONIES/mL ENTEROCOCCUS FAECALIS  C difficile quick scan w PCR reflex     Status: None   Collection Time: 04/20/18 10:12 PM  Result Value Ref Range Status   C Diff antigen NEGATIVE NEGATIVE Final   C Diff toxin NEGATIVE NEGATIVE Final   C Diff interpretation No C. difficile detected.  Final    Comment: Performed at [facility], [place] [address], [place], [place] [address]  Expectorated sputum assessment w rflx to resp cult     Status: None   Collection Time: 04/21/18  2:59 PM  Result Value Ref Range Status   Specimen Description EXPECTORATED SPUTUM  Final   Special Requests NONE  Final   Sputum  evaluation   Final    THIS SPECIMEN IS ACCEPTABLE FOR SPUTUM CULTURE Performed at [facility], [place] [address], [place], [place] [address]    Report Status 04/21/2018 FINAL  Final  Culture, respiratory     Status: None (Preliminary result)   Collection Time: 04/21/18  2:59 PM  Result Value Ref Range Status   Specimen Description EXPECTORATED SPUTUM  Final   Special Requests NONE Reflexed from [id number]  Final   Gram Stain PENDING  Incomplete   Culture   Final    CULTURE REINCUBATED FOR BETTER GROWTH Performed at [facility], [place] [address] [place], [place] [address]    Report Status PENDING  Incomplete    Radiology Reports Dg Chest 2 View  Result Date: 04/19/2018 CLINICAL DATA:  Shortness of breath for 1 week EXAM: CHEST - 2 VIEW COMPARISON:  04/16/2018 FINDINGS: Cardiac shadow is enlarged but stable. Some improved aeration in the right upper lobe is noted with near complete resolution of previously seen consolidation. Patchy infiltrate is again noted in the left mid and lower lung. Generalized central perihilar markings are noted most consistent with congestive failure and edema. IMPRESSION: Improved aeration in the right upper lobe although persistent infiltrate is seen. Persistent left basilar infiltrate is noted. New increased perihilar markings consistent with edema. Electronically Signed   By: Elta Guadeloupe  Lukens M.D.   On: 04/19/2018 06:27   Ct Chest W Contrast  Result Date: 04/20/2018 CLINICAL DATA:  Cough, pneumonia, leukocytosis EXAM: CT CHEST WITH CONTRAST TECHNIQUE: Multidetector CT imaging of the chest was performed during intravenous contrast administration. CONTRAST:  64m OMNIPAQUE IOHEXOL 300 MG/ML  SOLN COMPARISON:  Chest radiograph dated 04/19/2018 FINDINGS: Cardiovascular: The heart is top-normal in size. No pericardial effusion. No evidence of thoracic aortic aneurysm. Mediastinum/Nodes: Small mediastinal lymph nodes. Visualized thyroid is grossly unremarkable.  Lungs/Pleura: Right upper lobe opacity/consolidation with air bronchograms, suggesting pneumonia. Associated cavitation in the posterior aspect of the right upper lobe (series 4/image 48), suggesting cavitary pneumonia/pulmonary abscess. Additional ground-glass opacity with air bronchograms in the right middle lobe, left upper lobe/lingula, and anterior left lower lobe. Mild patchy/nodular opacity in the right lower lobe. This appearance is compatible with multifocal pneumonia. No definite pleural effusions to suggest superimposed interstitial edema. No pneumothorax. Upper Abdomen: Visualized appear upper abdomen is notable for hepatomegaly with suspected multifocal hepatic cysts, although poorly evaluated. Musculoskeletal: Visualized osseous structures are within normal limits. IMPRESSION: Multifocal pneumonia, as described above, with cavitation/abscess in the posterior right upper lobe. Electronically Signed   By: SJulian HyM.D.   On: 04/20/2018 18:06   Dg Chest Port 1 View  Result Date: 04/16/2018 CLINICAL DATA:  Chest tightness.  Shortness of breath. EXAM: PORTABLE CHEST 1 VIEW COMPARISON:  04/15/2018. FINDINGS: Persistent right upper lung atelectasis/consolidation. No change. Progressive prominent left lung infiltrate. Heart size stable. Normal pulmonary vascularity. No acute bony abnormality. IMPRESSION: 1. Persistent right upper lung atelectasis/consolidation. No change. 2.  Progressive left lung base infiltrate. Electronically Signed   By: TMarcello Moores Register   On: 04/16/2018 08:52   Dg Chest Portable 1 View  Result Date: 04/15/2018 CLINICAL DATA:  Initial evaluation for acute shortness of breath. EXAM: PORTABLE CHEST 1 VIEW COMPARISON:  None. FINDINGS: Mild cardiomegaly. Mediastinal silhouette within normal limits. Lungs hypoinflated. Dense consolidative opacity present within the right upper lobe, concerning for pneumonia. Associated air bronchograms. DISH in ule patchy left perihilar  infiltrate also concerning for infection. Underlying perihilar vascular congestion without overt pulmonary edema. No pleural effusion. No pneumothorax. No acute osseous abnormality. IMPRESSION: Multifocal infiltrates involving the right upper lobe and left perihilar region, concerning for multifocal pneumonia. Electronically Signed   By: BJeannine BogaM.D.   On: 04/15/2018 05:56    CBC Recent Labs  Lab 04/18/18 0303 04/19/18 0219 04/20/18 0404 04/21/18 0252 04/22/18 0232  WBC 24.3* 35.1* 42.8* 31.9* 22.8*  HGB 8.1* 9.0* 8.1* 7.7* 6.4*  HCT 25.3* 30.2* 26.1* 24.6* 21.5*  PLT 209 257 245 252 295  MCV 76.4* 78.2* 77.0* 76.9* 78.5*  MCH 24.5* 23.3* 23.9* 24.1* 23.4*  MCHC 32.0 29.8* 31.0 31.3 29.8*  RDW 13.7 14.3 14.5 14.2 14.3  LYMPHSABS 3.2 3.9 4.6* 3.2 3.9  MONOABS 1.7* 2.5* 3.2* 1.6* 1.8*  EOSABS 0.5 0.7* 0.6* 0.6* 0.5  BASOSABS 0.0 0.4* 0.0 0.0 0.0   Chemistries  Recent Labs  Lab 04/18/18 0303 04/19/18 0219 04/20/18 1415 04/21/18 0252 04/21/18 1513 04/22/18 0232  NA 136 137 137 136  --  138  K 3.1* 4.6 3.0* 2.9*  --  3.6  CL 107 106 104 102  --  106  CO2 18* 19* 22 20*  --  24  GLUCOSE 118* 95 122* 126*  --  101*  BUN 27* 16 7 <5*  --  <5*  CREATININE 1.52* 1.45* 1.14* 1.19*  --  1.02*  CALCIUM 7.8* 8.2*  8.4* 8.2*  --  7.9*  AST  --   --   --   --  30  --   ALT  --   --   --   --  24  --   ALKPHOS  --   --   --   --  114  --   BILITOT  --   --   --   --  0.5  --       Component Value Date/Time   BNP 1,013.5 (H) 04/15/2018 0630   Roxan Hockey M.D on 04/22/2018 at 4:24 PM  Pager---[phone] Go to [email or web address] - password [email or web address] for contact info  [facility] - Office  [phone]

## 2018-04-22 NOTE — Progress Notes (Signed)
[facility]  Date of Admission:  04/15/2018     Total days of antibiotics 9         ASSESSMENT/PLAN  Ms. Deupree is on Day 9  (Day 3 of ampicillin-sulbactam) of antimicrobial therapy for multifocal pneumonia with cavitation/abscess in the posterior right upper lobe most likely of anaerobic origin with previous urine antigen positive for strep.  Respiratory cultures from 12/8 remain in process with blood cultures from 12/5 remaining without growth. Hepatic panel within normal ranges and haptoglobin pending. Clinically she continues to improve with ampicillin/sulbactam despite anemia. Anemia remains from unclear source with 1 unit of PRBC pending. Lower extremity edema likely related to fluid overload.   1. Continue current dose of ampicillin-sulbactam. Given abscess would recommend treatment for at least 14 days with ampicillin-sulbactam with end date of 12/21. 2. Anemia management per primary team. Does not appear to be likely mycoplasam.  3. Monitor cultures and fever curve.   Principal Problem:   Sepsis  Active Problems:   AKI (acute kidney injury) (HCC)   RUL and LLL Pneumonia   Hypoxic Respiratory failure, acute (HCC)   Lung Abscess/Multi-Focal Bilateral Pneumonia   . enoxaparin (LOVENOX) injection  0.5 mg/kg Subcutaneous Daily  . guaiFENesin  600 mg Oral BID  . ipratropium-albuterol  3 mL Nebulization TID  . lactobacillus  1 g Oral TID WC & HS    SUBJECTIVE: Mildly elevated temperature of 100.1 overnight with improving leukocytosis now down to 22.8.  Noted to have anemia of 6.4 with orders to transfuse 1 unit of packed red blood cells. HIV screen non-reactive.   Continues to have a non-productive cough. Having a headache this morning with concern for edema in her bilateral lower extremities.   No Known Allergies   Review of Systems: Review of Systems  Constitutional: Negative for chills, fever and malaise/fatigue.  Respiratory: Positive  for cough. Negative for sputum production, shortness of breath and wheezing.   Cardiovascular: Positive for leg swelling. Negative for chest pain.  Gastrointestinal: Negative for abdominal pain, constipation, diarrhea, nausea and vomiting.  Skin: Negative for rash.  Neurological: Positive for headaches.     OBJECTIVE: Vitals:   04/22/18 0456 04/22/18 0637 04/22/18 0707 04/22/18 0720  BP: 120/85 (!) 141/90    Pulse: 92 94 95   Resp:  20 20   Temp: 98.2 F (36.8 C) 98.6 F (37 C) 99.3 F (37.4 C) 98.5 F (36.9 C)  TempSrc: Oral Oral Oral Oral  SpO2: 98% 99% 98%   Weight:      Height:       Body mass index is 53.93 kg/m.  Physical Exam  Constitutional: She is oriented to person, place, and time. She appears well-developed and well-nourished. No distress.  Lying in bed with head of bed elevated.   Cardiovascular: Normal rate, regular rhythm, normal heart sounds and intact distal pulses. Exam reveals no gallop and no friction rub.  No murmur heard. Moderate bilateral lower extremity edema.  Pulmonary/Chest: Effort normal and breath sounds normal. No respiratory distress. She has no decreased breath sounds. She has no wheezes. She has no rhonchi. She has no rales.  Neurological: She is alert and oriented to person, place, and time.  Skin: Skin is warm and dry.  Psychiatric: She has a normal mood and affect.    Lab Results Lab Results  Component Value Date   WBC 22.8 (H) 04/22/2018   HGB 6.4 (LL) 04/22/2018   HCT 21.5 (L) 04/22/2018  MCV 78.5 (L) 04/22/2018   PLT 295 04/22/2018    Lab Results  Component Value Date   CREATININE 1.02 (H) 04/22/2018   BUN <5 (L) 04/22/2018   NA 138 04/22/2018   K 3.6 04/22/2018   CL 106 04/22/2018   CO2 24 04/22/2018    Lab Results  Component Value Date   ALT 24 04/21/2018   AST 30 04/21/2018   ALKPHOS 114 04/21/2018   BILITOT 0.5 04/21/2018     Microbiology: Recent Results (from the past 240 hour(s))  Blood culture (routine  x 2)     Status: None   Collection Time: 04/15/18  5:41 AM  Result Value Ref Range Status   Specimen Description   Final    BLOOD RIGHT ARM Performed at [facility] [place], [address] [place], [place] [address]    Special Requests   Final    BOTTLES DRAWN AEROBIC AND ANAEROBIC Blood Culture adequate volume Performed at [facility] [place], [address] [place], [place] [address]    Culture   Final    NO GROWTH 5 DAYS Performed at [facility], [address] [place], [place] [address]    Report Status 04/20/2018 FINAL  Final  Blood culture (routine x 2)     Status: None   Collection Time: 04/15/18  6:18 AM  Result Value Ref Range Status   Specimen Description   Final    BLOOD LEFT HAND Performed at [facility] [place], [address] [place], [place] [address]    Special Requests   Final    BOTTLES DRAWN AEROBIC AND ANAEROBIC Blood Culture adequate volume Performed at [facility] [place], [address] [place], [place] [address]    Culture   Final    NO GROWTH 5 DAYS Performed at [facility], [address] [place], [place] [address]    Report Status 04/20/2018 FINAL  Final  Respiratory Panel by PCR     Status: None   Collection Time: 04/15/18 11:01 AM  Result Value Ref Range Status   Adenovirus NOT DETECTED NOT DETECTED Final   Coronavirus 229E NOT DETECTED NOT DETECTED Final   Coronavirus HKU1 NOT DETECTED NOT DETECTED Final   Coronavirus NL63 NOT DETECTED NOT DETECTED Final   Coronavirus OC43 NOT DETECTED NOT DETECTED Final   Metapneumovirus NOT DETECTED NOT DETECTED Final   Rhinovirus / Enterovirus NOT DETECTED NOT DETECTED Final   Influenza A NOT DETECTED NOT DETECTED Final   Influenza B NOT DETECTED NOT DETECTED Final   Parainfluenza Virus 1 NOT DETECTED NOT DETECTED Final   Parainfluenza Virus 2 NOT DETECTED NOT DETECTED Final   Parainfluenza Virus 3 NOT DETECTED NOT DETECTED Final   Parainfluenza Virus 4 NOT DETECTED  NOT DETECTED Final   Respiratory Syncytial Virus NOT DETECTED NOT DETECTED Final   Bordetella pertussis NOT DETECTED NOT DETECTED Final   Chlamydophila pneumoniae NOT DETECTED NOT DETECTED Final   Mycoplasma pneumoniae NOT DETECTED NOT DETECTED Final    Comment: Performed at [facility], [address] [place], [place] [address]  MRSA PCR Screening     Status: None   Collection Time: 04/15/18 11:01 AM  Result Value Ref Range Status   MRSA by PCR NEGATIVE NEGATIVE Final    Comment:        The GeneXpert MRSA Assay (FDA approved for NASAL specimens only), is one component of a comprehensive MRSA colonization surveillance program. It  is not intended to diagnose MRSA infection nor to guide or monitor treatment for MRSA infections. Performed at [facility], [address] [place], [place] [address]   Culture, blood (Routine X 2) w Reflex to ID Panel     Status: None (Preliminary result)   Collection Time: 04/18/18 12:39 PM  Result Value Ref Range Status   Specimen Description BLOOD RIGHT ANTECUBITAL  Final   Special Requests   Final    BOTTLES DRAWN AEROBIC ONLY Blood Culture adequate volume   Culture   Final    NO GROWTH 3 DAYS Performed at [facility], [address], [place], [place] [address]    Report Status PENDING  Incomplete  Urine Culture     Status: Abnormal   Collection Time: 04/18/18  7:51 PM  Result Value Ref Range Status   Specimen Description URINE, CLEAN CATCH  Final   Special Requests   Final    Normal Performed at [facility], [address], [place], [place] [address]    Culture 20,000 COLONIES/mL ENTEROCOCCUS FAECALIS (A)  Final   Report Status 04/21/2018 FINAL  Final   Organism ID, Bacteria ENTEROCOCCUS FAECALIS (A)  Final      Susceptibility   Enterococcus faecalis - MIC*    AMPICILLIN <=2 SENSITIVE Sensitive     LEVOFLOXACIN 1 SENSITIVE Sensitive     NITROFURANTOIN <=16 SENSITIVE Sensitive     VANCOMYCIN 1 SENSITIVE  Sensitive     * 20,000 COLONIES/mL ENTEROCOCCUS FAECALIS  C difficile quick scan w PCR reflex     Status: None   Collection Time: 04/20/18 10:12 PM  Result Value Ref Range Status   C Diff antigen NEGATIVE NEGATIVE Final   C Diff toxin NEGATIVE NEGATIVE Final   C Diff interpretation No C. difficile detected.  Final    Comment: Performed at [facility], [address] [place], [place] [address]  Expectorated sputum assessment w rflx to resp cult     Status: None   Collection Time: 04/21/18  2:59 PM  Result Value Ref Range Status   Specimen Description EXPECTORATED SPUTUM  Final   Special Requests NONE  Final   Sputum evaluation   Final    THIS SPECIMEN IS ACCEPTABLE FOR SPUTUM CULTURE Performed at [facility], [address], [place], [place] [address]    Report Status 04/21/2018 FINAL  Final     Marcos Eke, NP [facility] [phone] Pager  04/22/2018  8:34 AM

## 2018-04-23 LAB — CBC WITH DIFFERENTIAL/PLATELET
Abs Immature Granulocytes: 0.84 10*3/uL — ABNORMAL HIGH (ref 0.00–0.07)
BASOS ABS: 0 10*3/uL (ref 0.0–0.1)
BASOS PCT: 0 %
Eosinophils Absolute: 0.3 10*3/uL (ref 0.0–0.5)
Eosinophils Relative: 2 %
HCT: 25.1 % — ABNORMAL LOW (ref 36.0–46.0)
Hemoglobin: 7.6 g/dL — ABNORMAL LOW (ref 12.0–15.0)
Immature Granulocytes: 5 %
Lymphocytes Relative: 20 %
Lymphs Abs: 3.5 10*3/uL (ref 0.7–4.0)
MCH: 24.1 pg — ABNORMAL LOW (ref 26.0–34.0)
MCHC: 30.3 g/dL (ref 30.0–36.0)
MCV: 79.7 fL — ABNORMAL LOW (ref 80.0–100.0)
Monocytes Absolute: 1.1 10*3/uL — ABNORMAL HIGH (ref 0.1–1.0)
Monocytes Relative: 6 %
Neutro Abs: 11.9 10*3/uL — ABNORMAL HIGH (ref 1.7–7.7)
Neutrophils Relative %: 67 %
Platelets: 338 10*3/uL (ref 150–400)
RBC: 3.15 MIL/uL — ABNORMAL LOW (ref 3.87–5.11)
RDW: 14.6 % (ref 11.5–15.5)
WBC: 17.7 10*3/uL — ABNORMAL HIGH (ref 4.0–10.5)
nRBC: 0.3 % — ABNORMAL HIGH (ref 0.0–0.2)

## 2018-04-23 LAB — CULTURE, BLOOD (ROUTINE X 2)
Culture: NO GROWTH
Special Requests: ADEQUATE

## 2018-04-23 LAB — BPAM RBC
Blood Product Expiration Date: 202001032359
ISSUE DATE / TIME: 201912090645
Unit Type and Rh: 5100

## 2018-04-23 LAB — TYPE AND SCREEN
ABO/RH(D): O POS
Antibody Screen: NEGATIVE
Unit division: 0

## 2018-04-23 MED ORDER — AMOXICILLIN-POT CLAVULANATE 875-125 MG PO TABS: 1.0000 | ORAL_TABLET | Freq: Once | ORAL | Status: DC

## 2018-04-23 MED ORDER — ONDANSETRON 4 MG PO TBDP: 4.0000 mg | ORAL_TABLET | Freq: Three times a day (TID) | ORAL | 0 refills | Status: DC | PRN

## 2018-04-23 MED ORDER — ALBUTEROL SULFATE HFA 108 (90 BASE) MCG/ACT IN AERS: 2.0000 | INHALATION_SPRAY | RESPIRATORY_TRACT | 0 refills | Status: DC | PRN

## 2018-04-23 MED ORDER — GUAIFENESIN-CODEINE 100-10 MG/5ML PO SOLN: 10.0000 mL | Freq: Four times a day (QID) | ORAL | 0 refills | Status: DC | PRN

## 2018-04-23 MED ORDER — LOSARTAN POTASSIUM 100 MG PO TABS: 100.0000 mg | ORAL_TABLET | Freq: Every day | ORAL | 2 refills | Status: AC

## 2018-04-23 MED ORDER — FLORANEX PO PACK: 1.0000 g | PACK | Freq: Three times a day (TID) | ORAL | 0 refills | Status: DC

## 2018-04-23 MED ORDER — FERROUS SULFATE 325 (65 FE) MG PO TBEC: 325.0000 mg | DELAYED_RELEASE_TABLET | Freq: Two times a day (BID) | ORAL | 1 refills | Status: AC

## 2018-04-23 MED ORDER — GUAIFENESIN ER 600 MG PO TB12: 600.0000 mg | ORAL_TABLET | Freq: Two times a day (BID) | ORAL | 0 refills | Status: DC

## 2018-04-23 MED ORDER — POTASSIUM CHLORIDE CRYS ER 20 MEQ PO TBCR: 40.0000 meq | EXTENDED_RELEASE_TABLET | Freq: Once | ORAL | Status: DC

## 2018-04-23 MED ORDER — AMOXICILLIN-POT CLAVULANATE 875-125 MG PO TABS: 1.0000 | ORAL_TABLET | Freq: Two times a day (BID) | ORAL | 0 refills | Status: AC

## 2018-04-23 MED ORDER — OXYCODONE HCL 5 MG PO TABS: 5.0000 mg | ORAL_TABLET | ORAL | 0 refills | Status: DC | PRN

## 2018-04-23 MED ORDER — AMLODIPINE BESYLATE 10 MG PO TABS: 10.0000 mg | ORAL_TABLET | Freq: Every day | ORAL | 2 refills | Status: DC

## 2018-04-23 MED ORDER — IPRATROPIUM-ALBUTEROL 0.5-2.5 (3) MG/3ML IN SOLN: 3.0000 mL | Freq: Three times a day (TID) | RESPIRATORY_TRACT | 1 refills | Status: DC

## 2018-04-23 NOTE — Progress Notes (Signed)
Hannah Harvey to be D/C'd  per MD order. Discussed with the patient and all questions fully answered.  VSS, Skin clean, dry and intact without evidence of skin break down, no evidence of skin tears noted.  IV catheter discontinued intact. Site without signs and symptoms of complications. Dressing and pressure applied.  An After Visit Summary was printed and given to the patient. Patient received prescription.  D/c education completed with patient/family including follow up instructions, medication list, d/c activities limitations if indicated, with other d/c instructions as indicated by MD - patient able to verbalize understanding, all questions fully answered.   Patient instructed to return to ED, call 911, or call MD for any changes in condition.   Patient to be escorted via WC, and D/C home via private auto.

## 2018-04-23 NOTE — Plan of Care (Signed)
  Problem: Education: Goal: Knowledge of General Education information will improve Description Including pain rating scale, medication(s)/side effects and non-pharmacologic comfort measures Outcome: Progressing   Problem: Health Behavior/Discharge Planning: Goal: Ability to manage health-related needs will improve Outcome: Progressing   Problem: Clinical Measurements: Goal: Ability to maintain clinical measurements within normal limits will improve Outcome: Progressing Goal: Diagnostic test results will improve Outcome: Progressing Goal: Respiratory complications will improve Outcome: Progressing Goal: Cardiovascular complication will be avoided Outcome: Progressing   Problem: Activity: Goal: Risk for activity intolerance will decrease Outcome: Progressing   Problem: Nutrition: Goal: Adequate nutrition will be maintained Outcome: Progressing   Problem: Coping: Goal: Level of anxiety will decrease Outcome: Progressing   Problem: Elimination: Goal: Will not experience complications related to bowel motility Outcome: Progressing Goal: Will not experience complications related to urinary retention Outcome: Progressing   Problem: Pain Managment: Goal: General experience of comfort will improve Outcome: Progressing   Problem: Safety: Goal: Ability to remain free from injury will improve Outcome: Progressing   Problem: Skin Integrity: Goal: Risk for impaired skin integrity will decrease Outcome: Progressing   Problem: Activity: Goal: Ability to tolerate increased activity will improve Outcome: Progressing   Problem: Clinical Measurements: Goal: Ability to maintain a body temperature in the normal range will improve Outcome: Progressing   Problem: Respiratory: Goal: Ability to maintain adequate ventilation will improve Outcome: Progressing Goal: Ability to maintain a clear airway will improve Outcome: Progressing

## 2018-04-23 NOTE — Progress Notes (Signed)
SATURATION QUALIFICATIONS: (This note is used to comply with regulatory documentation for home oxygen)  Patient Saturations on Room Air at Rest = 98%  Patient Saturations on Room Air while Ambulating = 87%  Patient Saturations on 2 Liters of oxygen while Ambulating = 97%  Please briefly explain why patient needs home oxygen:

## 2018-04-23 NOTE — Discharge Instructions (Signed)
1)You Have Bilateral Strep Pneumonia/Lung Abscess/Multi-Focal Bilateral Pneumonia----take Augmentin/amoxicillin/clavulanic acid antibiotic 1 tablet twice a day for the next 18 days as prescribed 2) you need repeat CT scan of your chest in 3 to 4 weeks to reevaluate your pneumonia and lung abscess 3) you need repeat BMP/kidney and electrolyte test and repeat CBC/complete blood count with your primary care physician within a week 4) you are excused from work until you are seen by your primary care physician for reevaluation in a week 5) use oxygen at 2 L/min continuously via nasal cannula when you sleep and with activity 6) talk to your doctor about referral for sleep study as you probably have sleep apnea you need a sleep study in order to get a CPAP machine 7) use nebulizer machine and other medications as prescribed 8) call if excessive diarrhea due to risk of C. difficile colitis with long-term antibiotics

## 2018-04-23 NOTE — Discharge Summary (Signed)
Hannah Harvey, is a 32 y.o. female  DOB 10/08/1985  MRN [id number].  Admission date:  04/15/2018  Admitting Physician  Frederik Pear, MD  Discharge Date:  04/23/2018   Primary MD  Gregor Hams, FNP  Recommendations for primary care physician for things to follow:  1)You Have Bilateral Strep Pneumonia/Lung Abscess/Multi-Focal Bilateral Pneumonia----take Augmentin/amoxicillin/clavulanic acid antibiotic 1 tablet twice a day for the next 18 days as prescribed 2) you need repeat CT scan of your chest in 3 to 4 weeks to reevaluate your pneumonia and lung abscess 3) you need repeat BMP/kidney and electrolyte test and repeat CBC/complete blood count with your primary care physician within a week 4) you are excused from work until you are seen by your primary care physician for reevaluation in a week 5) use oxygen at 2 L/min continuously via nasal cannula when you sleep and with activity 6) talk to your doctor about referral for sleep study as you probably have sleep apnea you need a sleep study in order to get a CPAP machine 7) use nebulizer machine and other medications as prescribed 8) call if excessive diarrhea due to risk of C. difficile colitis with long-term antibiotics   Admission Diagnosis  Acute respiratory failure with hypoxia ([place]) [J96.01] AKI (acute kidney injury) ([place]) [N17.9] Septic shock ([place]) [A41.9, R65.21] Multifocal pneumonia [J18.9] Respiratory failure ([place]) [J96.90]   Discharge Diagnosis  Acute respiratory failure with hypoxia ([place]) [J96.01] AKI (acute kidney injury) ([place]) [N17.9] Septic shock ([place]) [A41.9, R65.21] Multifocal pneumonia [J18.9] Respiratory failure ([place]) [J96.90]    Principal Problem:   Sepsis  Active Problems:   RUL and LLL Pneumonia   Hypoxic Respiratory failure, acute ([place])   AKI (acute kidney injury) ([place])   Lung Abscess/Multi-Focal Bilateral  Pneumonia      Past Medical History:  Diagnosis Date  . Hypertension     Past Surgical History:  Procedure Laterality Date  . CESAREAN SECTION    . CHOLECYSTECTOMY         HPI  from the history and physical done on the day of admission:   HPI on Admission as documented by Admitting Physician:-   32 year old generally healthy woman with 48h history of NP cough and severe dyspnea starting around 0200 on 12/2.  Placed on BiPAP and started on antibiotics for suspected CAP and transferred to [facility]. Found to have AKI and lactic acidosis.  Critically ill due to acute hypoxic respiratory failure due to community acquired pneumonia. Requiring NIV support.  Given consolidation and diarrhea, pneumococcus likely.  Early sepsis with evidence of tissue hypoperfusion with elevated lactate AKI  Elevated BNP - possible component of HF, or RV dysfunction from hypoxia or renal failure induced volume overload.     Hospital Course:   Brief Summary 32 year old previously healthy female admitted on 04/15/2018 with sepsis secondary to pneumonia, patient was found to have acute hypoxic respiratory failure as well as acute kidney injury in the setting of sepsis  CT chest w/contrast 04/20/18 IMPRESSION: Multifocal pneumonia, as described  above, with cavitation/abscess in the posterior right upper lobe.   Plan:-  1)Sepsis--- POA----secondary to bilateral strep pneumonia with lung abscess and cavitation,  Legionella test negative, respiratory panel negative, white count is down to 17k from  42.8 k,  due to lung abscess/cavitary lesion,   discussed with infectious disease consultant Dr. Linus Salmons, CT chest with contrast to rule out lung abscess done on 04/20/2018 consistent with cavitary lesion/abscess in the lungs,treated with Iv Unasyn (04/20/18) for anaerobic coverage, STOPped  azithromycin and stopped Rocephin on 04/20/2018 ,  patient met sepsis criteria on admission with tachycardia, tachypnea, hypoxia, AKI  reflecting end-organ damage and lactic acidosis reflecting poor tissue perfusion---  .. c/n  IV fluid rate due to tachycardia and concerns about AKI with contrast study.  ID recommends discharge home on Augmentin through 05/11/2018 to complete 21 days course, repeat CT chest post completion of antibiotics  2)Bilateral Strep Pneumonia/Lung Abscess/Multi-Focal Bilateral Pneumonia------ patient with right upper lobe and left lower lobe pneumonia with hypoxia, white count is down to 17k from  42.8 due to lung abscess/cavitary lesion treat as above #1   3)Acute Hypoxic Respiratory Failure--- secondary to #1 and #2 above treat as above 1 and #2 , no longer requiring BiPAP, prior to admission patient was previously healthy,  no underlying lung disease., no history of tobacco use, mostly using oxygen now with minimum activity at rest able to come off O2....... okay to discharge home on home O2  4)AKI----acute kidney injury -due to sepsis with decreased renal perfusion, AKI is resolved, however giving contrast study on 04/20/2018 hydrate and watch renal function closely creatinine on admission= 4.88 ,   baseline creatinine =0.78    , creatinine is now= 1.0 , renally adjust medications, avoid nephrotoxic agents / dehydration /hypotension, renal function continues to improve with hydration, patient is voiding well, watch renal function closely due to contrast study on 04/20/2018  5) acute anemia--- query due to sepsis compounded by hemodilution, per patient no prior history of anemia, admission hemoglobin was 11.5, hemoglobin is up to 7.6 from 6.4 post transfusion of 1 unit packed cells , no evidence of GI blood loss, clinically indicated, LFTs on haptoglobin are not elevated,  , platelets are not low  6)Enterococcus UTI--- patient is largely asymptomatic, Unasyn prescribed above for lung abscess should cover adequately for this,   7)Morbid Obesity and OSA--- follow-up with PCP to schedule outpatient sleep  study patient most likely need a CPAP   Code Status : full  Disposition Plan  : home  Consults  :  PCCM/c  ID physician Dr. Linus Salmons  Discharge Condition: stable  Follow UP  Diet and Activity recommendation:  As advised  Discharge Instructions    Discharge Instructions    Call MD for:  difficulty breathing, headache or visual disturbances   Complete by:  As directed    Call MD for:  persistant dizziness or light-headedness   Complete by:  As directed    Call MD for:  persistant nausea and vomiting   Complete by:  As directed    Call MD for:  severe uncontrolled pain   Complete by:  As directed    Call MD for:  temperature >100.4   Complete by:  As directed    DME Nebulizer machine   Complete by:  As directed    Patient needs a nebulizer to treat with the following condition:  Dyspnea and respiratory abnormalities   Diet - low sodium heart healthy   Complete by:  As directed    Discharge instructions   Complete by:  As directed    1)You Have Bilateral Strep Pneumonia/Lung Abscess/Multi-Focal Bilateral Pneumonia----take Augmentin/amoxicillin/clavulanic acid antibiotic 1 tablet twice a day for the next 18 days as prescribed 2) you need repeat CT scan of your chest in 3 to 4 weeks to reevaluate your pneumonia and lung abscess 3) you need repeat BMP/kidney and electrolyte test and repeat CBC/complete blood count with your primary care physician within a week 4) you are excused from work until you are seen by your primary care physician for reevaluation in a week 5) use oxygen at 2 L/min continuously via nasal cannula when you sleep and with activity 6) talk to your doctor about referral for sleep study as you probably have sleep apnea you need a sleep study in order to get a CPAP machine 7) use nebulizer machine and other medications as prescribed 8) call if excessive diarrhea due to risk of C. difficile colitis with long-term antibiotics   Increase activity slowly   Complete  by:  As directed        Discharge Medications     Allergies as of 04/23/2018   No Known Allergies     Medication List    STOP taking these medications   aerochamber plus with mask inhaler   fluticasone 50 MCG/ACT nasal spray Commonly known as:  FLONASE   ibuprofen 200 MG tablet Commonly known as:  ADVIL,MOTRIN   loratadine 10 MG tablet Commonly known as:  CLARITIN     TAKE these medications   albuterol 108 (90 Base) MCG/ACT inhaler Commonly known as:  PROVENTIL HFA;VENTOLIN HFA Inhale 2 puffs into the lungs every 4 (four) hours as needed for wheezing or shortness of breath. What changed:    how much to take  when to take this   amLODipine 10 MG tablet Commonly known as:  NORVASC Take 1 tablet (10 mg total) by mouth daily.   amoxicillin-clavulanate 875-125 MG tablet Commonly known as:  AUGMENTIN Take 1 tablet by mouth 2 (two) times daily for 18 days.   ferrous sulfate 325 (65 FE) MG EC tablet Take 1 tablet (325 mg total) by mouth 2 (two) times daily with a meal.   guaiFENesin 600 MG 12 hr tablet Commonly known as:  MUCINEX Take 1 tablet (600 mg total) by mouth 2 (two) times daily.   guaiFENesin-codeine 100-10 MG/5ML syrup Take 10 mLs by mouth every 6 (six) hours as needed for cough.   ipratropium-albuterol 0.5-2.5 (3) MG/3ML Soln Commonly known as:  DUONEB Take 3 mLs by nebulization 3 (three) times daily.   lactobacillus Pack Take 1 packet (1 g total) by mouth 3 (three) times daily with meals.   losartan 100 MG tablet Commonly known as:  COZAAR Take 1 tablet (100 mg total) by mouth daily. What changed:  when to take this   ondansetron 4 MG disintegrating tablet Commonly known as:  ZOFRAN-ODT Take 1 tablet (4 mg total) by mouth every 8 (eight) hours as needed for nausea or vomiting.   oxyCODONE 5 MG immediate release tablet Commonly known as:  Oxy IR/ROXICODONE Take 1 tablet (5 mg total) by mouth every 4 (four) hours as needed for severe pain.     Vitamin D (Ergocalciferol) 1.25 MG (50000 UT) Caps capsule Commonly known as:  DRISDOL Take 50,000 Units by mouth every Monday.            Durable Medical Equipment  (From admission, onward)  Start     Ordered   04/23/18 1420  For home use only DME oxygen  Once    Comments:   Patient Saturations on Room Air at Rest = 98%  Patient Saturations on Room Air while Ambulating = 87%  Patient Saturations on 2 Liters of oxygen while Ambulating = 97%  Please briefly explain why patient needs home oxygen: Please give oxygen at 2 L/min continuously via nasal cannula with gaseous portability and conserving device due to hypoxic respiratory failure in the setting of lung abscess  Question Answer Comment  Mode or (Route) Nasal cannula   Frequency Continuous (stationary and portable oxygen unit needed)   Oxygen conserving device Yes   Oxygen delivery system Gas      04/23/18 1420   04/23/18 0000  DME Nebulizer machine    Question:  Patient needs a nebulizer to treat with the following condition  Answer:  Dyspnea and respiratory abnormalities   04/23/18 1430          Major procedures and Radiology Reports - PLEASE review detailed and final reports for all details, in brief -   Dg Chest 2 View  Result Date: 04/19/2018 CLINICAL DATA:  Shortness of breath for 1 week EXAM: CHEST - 2 VIEW COMPARISON:  04/16/2018 FINDINGS: Cardiac shadow is enlarged but stable. Some improved aeration in the right upper lobe is noted with near complete resolution of previously seen consolidation. Patchy infiltrate is again noted in the left mid and lower lung. Generalized central perihilar markings are noted most consistent with congestive failure and edema. IMPRESSION: Improved aeration in the right upper lobe although persistent infiltrate is seen. Persistent left basilar infiltrate is noted. New increased perihilar markings consistent with edema. Electronically Signed   By: Inez Catalina M.D.   On:  04/19/2018 06:27   Ct Chest W Contrast  Result Date: 04/20/2018 CLINICAL DATA:  Cough, pneumonia, leukocytosis EXAM: CT CHEST WITH CONTRAST TECHNIQUE: Multidetector CT imaging of the chest was performed during intravenous contrast administration. CONTRAST:  81m OMNIPAQUE IOHEXOL 300 MG/ML  SOLN COMPARISON:  Chest radiograph dated 04/19/2018 FINDINGS: Cardiovascular: The heart is top-normal in size. No pericardial effusion. No evidence of thoracic aortic aneurysm. Mediastinum/Nodes: Small mediastinal lymph nodes. Visualized thyroid is grossly unremarkable. Lungs/Pleura: Right upper lobe opacity/consolidation with air bronchograms, suggesting pneumonia. Associated cavitation in the posterior aspect of the right upper lobe (series 4/image 48), suggesting cavitary pneumonia/pulmonary abscess. Additional ground-glass opacity with air bronchograms in the right middle lobe, left upper lobe/lingula, and anterior left lower lobe. Mild patchy/nodular opacity in the right lower lobe. This appearance is compatible with multifocal pneumonia. No definite pleural effusions to suggest superimposed interstitial edema. No pneumothorax. Upper Abdomen: Visualized appear upper abdomen is notable for hepatomegaly with suspected multifocal hepatic cysts, although poorly evaluated. Musculoskeletal: Visualized osseous structures are within normal limits. IMPRESSION: Multifocal pneumonia, as described above, with cavitation/abscess in the posterior right upper lobe. Electronically Signed   By: SJulian HyM.D.   On: 04/20/2018 18:06   Dg Chest Port 1 View  Result Date: 04/16/2018 CLINICAL DATA:  Chest tightness.  Shortness of breath. EXAM: PORTABLE CHEST 1 VIEW COMPARISON:  04/15/2018. FINDINGS: Persistent right upper lung atelectasis/consolidation. No change. Progressive prominent left lung infiltrate. Heart size stable. Normal pulmonary vascularity. No acute bony abnormality. IMPRESSION: 1. Persistent right upper lung  atelectasis/consolidation. No change. 2.  Progressive left lung base infiltrate. Electronically Signed   By: TRio [place]  On: 04/16/2018 08:52   Dg Chest Portable 1  View  Result Date: 04/15/2018 CLINICAL DATA:  Initial evaluation for acute shortness of breath. EXAM: PORTABLE CHEST 1 VIEW COMPARISON:  None. FINDINGS: Mild cardiomegaly. Mediastinal silhouette within normal limits. Lungs hypoinflated. Dense consolidative opacity present within the right upper lobe, concerning for pneumonia. Associated air bronchograms. DISH in ule patchy left perihilar infiltrate also concerning for infection. Underlying perihilar vascular congestion without overt pulmonary edema. No pleural effusion. No pneumothorax. No acute osseous abnormality. IMPRESSION: Multifocal infiltrates involving the right upper lobe and left perihilar region, concerning for multifocal pneumonia. Electronically Signed   By: Jeannine Boga M.D.   On: 04/15/2018 05:56    Micro Results   Recent Results (from the past 240 hour(s))  Blood culture (routine x 2)     Status: None   Collection Time: 04/15/18  5:41 AM  Result Value Ref Range Status   Specimen Description   Final    BLOOD RIGHT ARM Performed at [facility], [address] [place], [place] [address]    Special Requests   Final    BOTTLES DRAWN AEROBIC AND ANAEROBIC Blood Culture adequate volume Performed at [facility], [address] [place], [place] [address]    Culture   Final    NO GROWTH 5 DAYS Performed at [facility], [place] [address] [place], [place] [address]    Report Status 04/20/2018 FINAL  Final  Blood culture (routine x 2)     Status: None   Collection Time: 04/15/18  6:18 AM  Result Value Ref Range Status   Specimen Description   Final    BLOOD LEFT HAND Performed at [facility], [address] [place], [place] [address]    Special Requests   Final    BOTTLES DRAWN AEROBIC AND ANAEROBIC Blood  Culture adequate volume Performed at [facility], [address] [place], [place] [address]    Culture   Final    NO GROWTH 5 DAYS Performed at [facility], [place] [address], [place], [place] [address]    Report Status 04/20/2018 FINAL  Final  Respiratory Panel by PCR     Status: None   Collection Time: 04/15/18 11:01 AM  Result Value Ref Range Status   Adenovirus NOT DETECTED NOT DETECTED Final   Coronavirus 229E NOT DETECTED NOT DETECTED Final   Coronavirus HKU1 NOT DETECTED NOT DETECTED Final   Coronavirus NL63 NOT DETECTED NOT DETECTED Final   Coronavirus OC43 NOT DETECTED NOT DETECTED Final   Metapneumovirus NOT DETECTED NOT DETECTED Final   Rhinovirus / Enterovirus NOT DETECTED NOT DETECTED Final   Influenza A NOT DETECTED NOT DETECTED Final   Influenza B NOT DETECTED NOT DETECTED Final   Parainfluenza Virus 1 NOT DETECTED NOT DETECTED Final   Parainfluenza Virus 2 NOT DETECTED NOT DETECTED Final   Parainfluenza Virus 3 NOT DETECTED NOT DETECTED Final   Parainfluenza Virus 4 NOT DETECTED NOT DETECTED Final   Respiratory Syncytial Virus NOT DETECTED NOT DETECTED Final   Bordetella pertussis NOT DETECTED NOT DETECTED Final   Chlamydophila pneumoniae NOT DETECTED NOT DETECTED Final   Mycoplasma pneumoniae NOT DETECTED NOT DETECTED Final    Comment: Performed at [facility], [place] [address] [place], [place] [address]  MRSA PCR Screening     Status: None   Collection Time: 04/15/18 11:01 AM  Result Value Ref Range Status   MRSA by PCR NEGATIVE NEGATIVE Final    Comment:  The GeneXpert MRSA Assay (FDA approved for NASAL specimens only), is one component of a comprehensive MRSA colonization surveillance program. It is not intended to diagnose MRSA infection nor to guide or monitor treatment for MRSA infections. Performed at [facility], [place] [address], [place], [place] [address]   Culture, blood (Routine X 2) w Reflex to ID Panel      Status: None   Collection Time: 04/18/18 12:39 PM  Result Value Ref Range Status   Specimen Description BLOOD RIGHT ANTECUBITAL  Final   Special Requests   Final    BOTTLES DRAWN AEROBIC ONLY Blood Culture adequate volume   Culture   Final    NO GROWTH 5 DAYS Performed at [facility], [place] [address], [place], [place] [address]    Report Status 04/23/2018 FINAL  Final  Urine Culture     Status: Abnormal   Collection Time: 04/18/18  7:51 PM  Result Value Ref Range Status   Specimen Description URINE, CLEAN CATCH  Final   Special Requests   Final    Normal Performed at [facility], [place] [address] [place], [place] [address]    Culture 20,000 COLONIES/mL ENTEROCOCCUS FAECALIS (A)  Final   Report Status 04/21/2018 FINAL  Final   Organism ID, Bacteria ENTEROCOCCUS FAECALIS (A)  Final      Susceptibility   Enterococcus faecalis - MIC*    AMPICILLIN <=2 SENSITIVE Sensitive     LEVOFLOXACIN 1 SENSITIVE Sensitive     NITROFURANTOIN <=16 SENSITIVE Sensitive     VANCOMYCIN 1 SENSITIVE Sensitive     * 20,000 COLONIES/mL ENTEROCOCCUS FAECALIS  C difficile quick scan w PCR reflex     Status: None   Collection Time: 04/20/18 10:12 PM  Result Value Ref Range Status   C Diff antigen NEGATIVE NEGATIVE Final   C Diff toxin NEGATIVE NEGATIVE Final   C Diff interpretation No C. difficile detected.  Final    Comment: Performed at [facility], [place] [address] [place], [place] [address]  Expectorated sputum assessment w rflx to resp cult     Status: None   Collection Time: 04/21/18  2:59 PM  Result Value Ref Range Status   Specimen Description EXPECTORATED SPUTUM  Final   Special Requests NONE  Final   Sputum evaluation   Final    THIS SPECIMEN IS ACCEPTABLE FOR SPUTUM CULTURE Performed at [facility], [place] [address], [place], [place] [address]    Report Status 04/21/2018 FINAL  Final  Culture, respiratory     Status: None (Preliminary result)   Collection Time:  04/21/18  2:59 PM  Result Value Ref Range Status   Specimen Description EXPECTORATED SPUTUM  Final   Special Requests NONE Reflexed from [id number]  Final   Gram Stain   Final    FEW WBC PRESENT, PREDOMINANTLY PMN MODERATE GRAM POSITIVE COCCI RARE GRAM NEGATIVE RODS RARE GRAM POSITIVE RODS RARE YEAST    Culture   Final    CULTURE REINCUBATED FOR BETTER GROWTH Performed at [facility], [place] [address], [place], [place] [address]    Report Status PENDING  Incomplete       Today   Subjective    Jose Alleyne today has no new complaints, cough is better dyspnea on exertion is better , had semisolid stool No fevers no chills, no night sweats         Patient has been seen and examined prior to discharge  Objective   Blood pressure (!) 146/87, pulse 84, temperature 98.1 F (36.7 C), temperature source Oral, resp. rate 20, height _0  (1.651 m), weight (!) 147 kg, last menstrual period 04/01/2018, SpO2 98 %.   Intake/Output Summary (Last 24 hours) at 04/23/2018 1756 Last data filed at 04/23/2018 1328 Gross per 24 hour  Intake 1957.82 ml  Output -  Net 1957.82 ml    Exam Gen:- Awake Alert, able to speak in  sentences HEENT:- [place].AT, No sclera icterus Nose-  1 L/min Neck-Supple Neck,No JVD,.  Lungs-improving air movement, no wheezing, no rales  CV- S1, S2 normal, regular  Abd-  +ve B.Sounds, Abd Soft, No tenderness, increased truncal adiposity    Extremity/Skin:- No  edema, pedal pulses present  Psych-affect is appropriate, oriented x3 Neuro-no new focal deficits, no tremors   Data Review   CBC w Diff:  Lab Results  Component Value Date   WBC 17.7 (H) 04/23/2018   HGB 7.6 (L) 04/23/2018   HCT 25.1 (L) 04/23/2018   PLT 338 04/23/2018   LYMPHOPCT 20 04/23/2018   BANDSPCT 0 04/22/2018   MONOPCT 6 04/23/2018   EOSPCT 2 04/23/2018   BASOPCT 0 04/23/2018    CMP:  Lab Results  Component Value Date   NA 139 04/22/2018   K 3.2 (L) 04/22/2018    CL 104 04/22/2018   CO2 22 04/22/2018   BUN <5 (L) 04/22/2018   CREATININE 1.06 (H) 04/22/2018   PROT 7.1 04/21/2018   ALBUMIN 2.1 (L) 04/21/2018   BILITOT 0.5 04/21/2018   ALKPHOS 114 04/21/2018   AST 30 04/21/2018   ALT 24 04/21/2018   Total Discharge time is about 33 minutes  Roxan Hockey M.D on 04/23/2018 at 5:56 PM  Pager---[phone]  Go to [email or web address] - password [email or web address] for contact info  [facility] - Office  [phone]

## 2018-04-24 LAB — CULTURE, RESPIRATORY W GRAM STAIN: Culture: NORMAL

## 2018-06-11 ENCOUNTER — Encounter (HOSPITAL_BASED_OUTPATIENT_CLINIC_OR_DEPARTMENT_OTHER): Payer: Self-pay | Admitting: Emergency Medicine

## 2018-06-11 ENCOUNTER — Other Ambulatory Visit: Payer: Self-pay

## 2018-06-11 ENCOUNTER — Emergency Department (HOSPITAL_BASED_OUTPATIENT_CLINIC_OR_DEPARTMENT_OTHER): Payer: 59

## 2018-06-11 ENCOUNTER — Emergency Department (HOSPITAL_BASED_OUTPATIENT_CLINIC_OR_DEPARTMENT_OTHER)
Admission: EM | Admit: 2018-06-11 | Discharge: 2018-06-11 | Disposition: A | Discharge status: Home / Self Care | Payer: 59 | Attending: Emergency Medicine | Admitting: Emergency Medicine

## 2018-06-11 DIAGNOSIS — I10 Essential (primary) hypertension: Secondary | ICD-10-CM | POA: Insufficient documentation

## 2018-06-11 DIAGNOSIS — R05 Cough: Secondary | ICD-10-CM | POA: Diagnosis not present

## 2018-06-11 DIAGNOSIS — R0601 Orthopnea: Secondary | ICD-10-CM | POA: Diagnosis not present

## 2018-06-11 DIAGNOSIS — Z79899 Other long term (current) drug therapy: Secondary | ICD-10-CM | POA: Insufficient documentation

## 2018-06-11 DIAGNOSIS — R0602 Shortness of breath: Secondary | ICD-10-CM | POA: Diagnosis not present

## 2018-06-11 DIAGNOSIS — R1013 Epigastric pain: Secondary | ICD-10-CM | POA: Insufficient documentation

## 2018-06-11 DIAGNOSIS — R1011 Right upper quadrant pain: Secondary | ICD-10-CM | POA: Insufficient documentation

## 2018-06-11 DIAGNOSIS — R079 Chest pain, unspecified: Secondary | ICD-10-CM | POA: Diagnosis not present

## 2018-06-11 HISTORY — DX: Pneumonia, unspecified organism: J18.9

## 2018-06-11 HISTORY — DX: Acute kidney failure, unspecified: N17.9

## 2018-06-11 HISTORY — DX: Obesity, unspecified: E66.9

## 2018-06-11 LAB — CBC WITH DIFFERENTIAL/PLATELET
Abs Immature Granulocytes: 0.03 10*3/uL (ref 0.00–0.07)
Basophils Absolute: 0 10*3/uL (ref 0.0–0.1)
Basophils Relative: 0 %
Eosinophils Absolute: 0.5 10*3/uL (ref 0.0–0.5)
Eosinophils Relative: 4 %
HEMATOCRIT: 34.5 % — AB (ref 36.0–46.0)
Hemoglobin: 10.2 g/dL — ABNORMAL LOW (ref 12.0–15.0)
Immature Granulocytes: 0 %
LYMPHS ABS: 3.2 10*3/uL (ref 0.7–4.0)
Lymphocytes Relative: 28 %
MCH: 23.9 pg — ABNORMAL LOW (ref 26.0–34.0)
MCHC: 29.6 g/dL — ABNORMAL LOW (ref 30.0–36.0)
MCV: 80.8 fL (ref 80.0–100.0)
Monocytes Absolute: 0.4 10*3/uL (ref 0.1–1.0)
Monocytes Relative: 4 %
Neutro Abs: 7.4 10*3/uL (ref 1.7–7.7)
Neutrophils Relative %: 64 %
Platelets: 320 10*3/uL (ref 150–400)
RBC: 4.27 MIL/uL (ref 3.87–5.11)
RDW: 16.2 % — ABNORMAL HIGH (ref 11.5–15.5)
WBC: 11.5 10*3/uL — ABNORMAL HIGH (ref 4.0–10.5)
nRBC: 0 % (ref 0.0–0.2)

## 2018-06-11 LAB — URINALYSIS, ROUTINE W REFLEX MICROSCOPIC
Bilirubin Urine: NEGATIVE
Glucose, UA: NEGATIVE mg/dL
Hgb urine dipstick: NEGATIVE
Ketones, ur: NEGATIVE mg/dL
LEUKOCYTES UA: NEGATIVE
Nitrite: NEGATIVE
Protein, ur: NEGATIVE mg/dL
Specific Gravity, Urine: 1.02 (ref 1.005–1.030)
pH: 5.5 (ref 5.0–8.0)

## 2018-06-11 LAB — COMPREHENSIVE METABOLIC PANEL
ALT: 24 U/L (ref 0–44)
AST: 30 U/L (ref 15–41)
Albumin: 3.6 g/dL (ref 3.5–5.0)
Alkaline Phosphatase: 106 U/L (ref 38–126)
Anion gap: 11 (ref 5–15)
BILIRUBIN TOTAL: 0.5 mg/dL (ref 0.3–1.2)
BUN: 8 mg/dL (ref 6–20)
CO2: 23 mmol/L (ref 22–32)
Calcium: 9.1 mg/dL (ref 8.9–10.3)
Chloride: 103 mmol/L (ref 98–111)
Creatinine, Ser: 0.73 mg/dL (ref 0.44–1.00)
GFR calc Af Amer: >60 mL/min (ref >60)
GFR calc non Af Amer: >60 mL/min (ref >60)
Glucose, Bld: 103 mg/dL — ABNORMAL HIGH (ref 70–99)
Potassium: 3.9 mmol/L (ref 3.5–5.1)
Sodium: 137 mmol/L (ref 135–145)
Total Protein: 7.8 g/dL (ref 6.5–8.1)

## 2018-06-11 LAB — BRAIN NATRIURETIC PEPTIDE: B Natriuretic Peptide: 31 pg/mL (ref 0.0–100.0)

## 2018-06-11 LAB — D-DIMER, QUANTITATIVE: D-Dimer, Quant: 1.57 ug/mL-FEU — ABNORMAL HIGH (ref 0.00–0.50)

## 2018-06-11 LAB — PREGNANCY, URINE: Preg Test, Ur: NEGATIVE

## 2018-06-11 LAB — LIPASE, BLOOD: Lipase: 45 U/L (ref 11–51)

## 2018-06-11 LAB — LACTIC ACID, PLASMA: Lactic Acid, Venous: 1.4 mmol/L (ref 0.5–1.9)

## 2018-06-11 LAB — TROPONIN I: Troponin I: <0.03 ng/mL (ref <0.03)

## 2018-06-11 MED ORDER — IOPAMIDOL (ISOVUE-370) INJECTION 76%
100.0000 mL | Freq: Once | INTRAVENOUS | Status: AC | PRN
  Administered 2018-06-11: 100 mL via INTRAVENOUS

## 2018-06-11 NOTE — ED Triage Notes (Signed)
Pain in bil low rib area, below both breasts, and also in left upper chest and left shoulder since yesterday.

## 2018-06-11 NOTE — ED Notes (Signed)
C/o cough, congestion low chest pain and left side cp increased w deep breath and moving arm

## 2018-06-11 NOTE — ED Provider Notes (Signed)
[facility] EMERGENCY DEPARTMENT Provider Note   CSN: [id number] Arrival date & time: 06/11/18  1054     History   Chief Complaint Chief Complaint  Patient presents with  . Chest Pain    HPI Hannah Harvey is a 33 y.o. female.  The history is provided by the patient and medical records. No language interpreter was used.  Chest Pain   Hannah Harvey is a 33 y.o. female who presents to the Emergency Department complaining of chest pain. She presents to the emergency department complaining of epigastric/chest pain and back pain. Symptoms began last night and is located along her left lower chest wall/upper abdomen as well as slightly on the right upper quadrant. Pain is described as aching and sharp in nature. Pain is worse with movement as well as deep breaths. She has shortness of breath and orthopnea is starting today. She has a mild, nonproductive cough. No reports of fevers, leg swelling, nausea, vomiting, diarrhea. She was admitted to the hospital on December 2 and discharged home on December 10. She had a complicated hospitalization for multifocal pneumonia with lung abscesses as well as septic shock and acute kidney injury. She was doing improved following hospitalization and was able to complete her course of antibiotics. She was unable to get her outpatient CT scan for follow-up due to insurance problems. She has experienced occasional aching in her chest intermittently since hospital discharge but her difficulty breathing and sharp pain are new since yesterday. She has a history of hypertension. No tobacco, alcohol, drug use. Past Medical History:  Diagnosis Date  . Hypertension   . Kidney failure, acute (HCC)   . Obesity   . Pneumonia     Patient Active Problem List   Diagnosis Date Noted  . Lung Abscess/Multi-Focal Bilateral Pneumonia 04/20/2018  . AKI (acute kidney injury) (HCC) 04/17/2018  . RUL and LLL Pneumonia 04/17/2018  . Hypoxic  Respiratory failure, acute (HCC) 04/17/2018  . Sepsis  04/17/2018  . Respiratory failure (HCC) 04/15/2018  . Breast abscess, right 10/17/2012    Past Surgical History:  Procedure Laterality Date  . CESAREAN SECTION    . CHOLECYSTECTOMY       OB History   No obstetric history on file.      Home Medications    Prior to Admission medications   Medication Sig Start Date End Date Taking? Authorizing Provider  amLODipine (NORVASC) 10 MG tablet Take 1 tablet (10 mg total) by mouth daily. 04/23/18   Shon Hale, MD  ferrous sulfate 325 (65 FE) MG EC tablet Take 1 tablet (325 mg total) by mouth 2 (two) times daily with a meal. 04/23/18   Emokpae, Courage, MD  losartan (COZAAR) 100 MG tablet Take 1 tablet (100 mg total) by mouth daily. 04/23/18   Shon Hale, MD  Vitamin D, Ergocalciferol, (DRISDOL) 1.25 MG (50000 UT) CAPS capsule Take 50,000 Units by mouth every Monday. 03/14/18   [provider]    Family History Family History  Problem Relation Age of Onset  . Hyperlipidemia Mother   . Heart disease Mother   . Hyperlipidemia Father     Social History Social History   Tobacco Use  . Smoking status: Never Smoker  . Smokeless tobacco: Never Used  Substance Use Topics  . Alcohol use: Yes    Comment: everyday  . Drug use: No     Allergies   Patient has no known allergies.   Review of Systems Review of Systems  Cardiovascular: Positive for  chest pain.  All other systems reviewed and are negative.    Physical Exam Updated Vital Signs BP (!) 130/98 (BP Location: Right Arm)   Pulse 82   Temp 98.4 F (36.9 C) (Oral)   Resp 18   Ht 5' 5.5" (1.664 m)   Wt (!) 140.2 kg   LMP 05/25/2018   SpO2 100%   BMI 50.64 kg/m   Physical Exam Vitals signs and nursing note reviewed.  Constitutional:      Appearance: She is well-developed.  HENT:     Head: Normocephalic and atraumatic.  Cardiovascular:     Rate and Rhythm: Normal rate and regular  rhythm.     Heart sounds: No murmur.  Pulmonary:     Effort: Pulmonary effort is normal. No respiratory distress.     Breath sounds: Normal breath sounds.  Abdominal:     Palpations: Abdomen is soft.     Tenderness: There is no guarding or rebound.     Comments: Moderate epigastric and left upper quadrant tenderness  Musculoskeletal:        General: No swelling or tenderness.  Skin:    General: Skin is warm and dry.  Neurological:     Mental Status: She is alert and oriented to person, place, and time.  Psychiatric:        Behavior: Behavior normal.      ED Treatments / Results  Labs (all labs ordered are listed, but only abnormal results are displayed) Labs Reviewed  COMPREHENSIVE METABOLIC PANEL - Abnormal; Notable for the following components:      Result Value   Glucose, Bld 103 (*)    All other components within normal limits  CBC WITH DIFFERENTIAL/PLATELET - Abnormal; Notable for the following components:   WBC 11.5 (*)    Hemoglobin 10.2 (*)    HCT 34.5 (*)    MCH 23.9 (*)    MCHC 29.6 (*)    RDW 16.2 (*)    All other components within normal limits  D-DIMER, QUANTITATIVE (NOT AT [facility]) - Abnormal; Notable for the following components:   D-Dimer, Quant 1.57 (*)    All other components within normal limits  PREGNANCY, URINE  TROPONIN I  BRAIN NATRIURETIC PEPTIDE  LIPASE, BLOOD  URINALYSIS, ROUTINE W REFLEX MICROSCOPIC  LACTIC ACID, PLASMA    EKG EKG Interpretation  Date/Time:  Tuesday June 11 2018 11:05:05 EST Ventricular Rate:  86 PR Interval:  192 QRS Duration: 98 QT Interval:  372 QTC Calculation: 445 R Axis:   -9 Text Interpretation:  Normal sinus rhythm Low voltage QRS Cannot rule out Anterior infarct , age undetermined Abnormal ECG Confirmed by Tilden Fossa [phone]) on 06/11/2018 11:11:08 AM Also confirmed by Tilden Fossa [phone]), editor Barbette Hair [phone])  on 06/11/2018 12:01:36 PM   Radiology Dg Chest 2 View  Result Date:  06/11/2018 CLINICAL DATA:  Chest pain. EXAM: CHEST - 2 VIEW COMPARISON:  CT 04/20/2018. Chest x-ray 04/19/2018, 04/16/2018, 04/15/2018. FINDINGS: Mediastinum and hilar structures normal. Heart size stable. Previously identified prominent bilateral filtrates have largely cleared with mild residual right upper lung. Continued follow-up to demonstrate complete resolution. No pleural effusion pneumothorax. No acute bony abnormality. IMPRESSION: Near complete resolution of previously identified prominent bilateral infiltrates with mild residual in the right upper lung. Continued follow-up chest x-rays to demonstrate complete resolution suggested. Electronically Signed   By: Maisie Fus  Register   On: 06/11/2018 11:33   Ct Angio Chest Pe W/cm &/or Wo Cm  Result Date: 06/11/2018 CLINICAL DATA:  33 year old female with acute LEFT chest pain, cough, congestion and bilateral flank pain. History of recent multifocal pneumonia with lung abscesses. EXAM: CT ANGIOGRAPHY CHEST CT ABDOMEN AND PELVIS WITH CONTRAST TECHNIQUE: Multidetector CT imaging of the chest was performed using the standard protocol during bolus administration of intravenous contrast. Multiplanar CT image reconstructions and MIPs were obtained to evaluate the vascular anatomy. Multidetector CT imaging of the abdomen and pelvis was performed using the standard protocol during bolus administration of intravenous contrast. CONTRAST:  100mL ISOVUE-370 IOPAMIDOL (ISOVUE-370) INJECTION 76% COMPARISON:  04/20/2018 chest CT. FINDINGS: CTA CHEST FINDINGS Cardiovascular: This is a technically borderline study due to respiratory motion artifact and borderline contrast opacification of the pulmonary arteries. No definite pulmonary emboli are identified. Cardiomegaly is present. No evidence of thoracic aortic aneurysm or pericardial effusion. Mediastinum/Nodes: No enlarged mediastinal, hilar, or axillary lymph nodes. Thyroid gland, trachea, and esophagus demonstrate no  significant findings. Lungs/Pleura: Bilateral airspace disease/consolidation identified on the prior study has resolved except for minimal soft tissue opacity/scarring along the RIGHT major/minor fissure. No airspace disease, consolidation, suspicious nodule, mass, pleural effusion or pneumothorax identified. Musculoskeletal: No acute or suspicious bony abnormalities are identified. Review of the MIP images confirms the above findings. CT ABDOMEN and PELVIS FINDINGS Hepatobiliary: Multiple low-density/fluid density masses within the liver are again noted, most likely representing cysts. The visualized mid-upper hepatic lesions do not appear significantly changed from 04/20/2018. Patient is status post cholecystectomy. No biliary dilatation. Pancreas: Unremarkable Spleen: Unremarkable Adrenals/Urinary Tract: The kidneys, adrenal glands and bladder are unremarkable. Stomach/Bowel: Stomach is within normal limits. Appendix appears normal. No evidence of bowel wall thickening, distention, or inflammatory changes. Vascular/Lymphatic: No significant vascular findings are present. No enlarged abdominal or pelvic lymph nodes. Reproductive: Uterus and bilateral adnexa are unremarkable. Other: No ascites, abscess or pneumoperitoneum. Musculoskeletal: No acute or suspicious bony abnormalities identified. Review of the MIP images confirms the above findings. IMPRESSION: 1. No evidence of acute abnormality. No evidence of pulmonary emboli or acute abnormality within the abdomen or pelvis. 2. Resolution of bilateral airspace disease/consolidation with mild residual RIGHT lung/pleural scarring. 3. Multiple low-density hepatic lesions again noted, most which likely represent cysts. If there is very strong clinical suspicion for hepatic abscess or metastatic disease, consider MRI with and without contrast for further evaluation. 4. Cardiomegaly Electronically Signed   By: Harmon PierJeffrey  Hu M.D.   On: 06/11/2018 15:07   Ct Abdomen Pelvis  W Contrast  Result Date: 06/11/2018 CLINICAL DATA:  33 year old female with acute LEFT chest pain, cough, congestion and bilateral flank pain. History of recent multifocal pneumonia with lung abscesses. EXAM: CT ANGIOGRAPHY CHEST CT ABDOMEN AND PELVIS WITH CONTRAST TECHNIQUE: Multidetector CT imaging of the chest was performed using the standard protocol during bolus administration of intravenous contrast. Multiplanar CT image reconstructions and MIPs were obtained to evaluate the vascular anatomy. Multidetector CT imaging of the abdomen and pelvis was performed using the standard protocol during bolus administration of intravenous contrast. CONTRAST:  100mL ISOVUE-370 IOPAMIDOL (ISOVUE-370) INJECTION 76% COMPARISON:  04/20/2018 chest CT. FINDINGS: CTA CHEST FINDINGS Cardiovascular: This is a technically borderline study due to respiratory motion artifact and borderline contrast opacification of the pulmonary arteries. No definite pulmonary emboli are identified. Cardiomegaly is present. No evidence of thoracic aortic aneurysm or pericardial effusion. Mediastinum/Nodes: No enlarged mediastinal, hilar, or axillary lymph nodes. Thyroid gland, trachea, and esophagus demonstrate no significant findings. Lungs/Pleura: Bilateral airspace disease/consolidation identified on the prior study has resolved except for minimal soft tissue opacity/scarring along the RIGHT major/minor  fissure. No airspace disease, consolidation, suspicious nodule, mass, pleural effusion or pneumothorax identified. Musculoskeletal: No acute or suspicious bony abnormalities are identified. Review of the MIP images confirms the above findings. CT ABDOMEN and PELVIS FINDINGS Hepatobiliary: Multiple low-density/fluid density masses within the liver are again noted, most likely representing cysts. The visualized mid-upper hepatic lesions do not appear significantly changed from 04/20/2018. Patient is status post cholecystectomy. No biliary dilatation.  Pancreas: Unremarkable Spleen: Unremarkable Adrenals/Urinary Tract: The kidneys, adrenal glands and bladder are unremarkable. Stomach/Bowel: Stomach is within normal limits. Appendix appears normal. No evidence of bowel wall thickening, distention, or inflammatory changes. Vascular/Lymphatic: No significant vascular findings are present. No enlarged abdominal or pelvic lymph nodes. Reproductive: Uterus and bilateral adnexa are unremarkable. Other: No ascites, abscess or pneumoperitoneum. Musculoskeletal: No acute or suspicious bony abnormalities identified. Review of the MIP images confirms the above findings. IMPRESSION: 1. No evidence of acute abnormality. No evidence of pulmonary emboli or acute abnormality within the abdomen or pelvis. 2. Resolution of bilateral airspace disease/consolidation with mild residual RIGHT lung/pleural scarring. 3. Multiple low-density hepatic lesions again noted, most which likely represent cysts. If there is very strong clinical suspicion for hepatic abscess or metastatic disease, consider MRI with and without contrast for further evaluation. 4. Cardiomegaly Electronically Signed   By: Harmon PierJeffrey  Hu M.D.   On: 06/11/2018 15:07    Procedures Procedures (including critical care time)  Medications Ordered in ED Medications  iopamidol (ISOVUE-370) 76 % injection 100 mL (100 mLs Intravenous Contrast Given 06/11/18 1423)     Initial Impression / Assessment and Plan / ED Course  I have reviewed the triage vital signs and the nursing notes.  Pertinent labs & imaging results that were available during my care of the patient were reviewed by me and considered in my medical decision making (see chart for details).     Patient with recent history of multifocal pneumonia with septic shock here for evaluation of chest pain, shortness of breath. She is non-toxic appearing on assessment. EKG with nonspecific ST changes, troponin is negative. CT is negative for PE or recurrent  pneumonia. There are liver cyst on her CT scan, current picture is not consistent with liver abscess. Discussed with patient findings of studies, plan to discharge home with PCP as well as scheduled cardiology follow-up in two days. Return precautions discussed.  Final Clinical Impressions(s) / ED Diagnoses   Final diagnoses:  Nonspecific chest pain    ED Discharge Orders    None       Tilden Fossaees, Chiffon Kittleson, MD 06/12/18 [phone]

## 2018-06-11 NOTE — Discharge Instructions (Addendum)
Your CT scan showed cysts on your liver.  Please see your family doctor for recheck.  Get rechecked immediately if you develop difficulty breathing, fevers, or new concerning symptoms.

## 2018-10-18 ENCOUNTER — Other Ambulatory Visit: Payer: Self-pay | Admitting: Family Medicine

## 2018-10-18 ENCOUNTER — Other Ambulatory Visit: Payer: Self-pay | Admitting: Nurse Practitioner

## 2018-10-18 DIAGNOSIS — N644 Mastodynia: Secondary | ICD-10-CM

## 2018-10-18 DIAGNOSIS — N611 Abscess of the breast and nipple: Secondary | ICD-10-CM

## 2018-10-23 ENCOUNTER — Other Ambulatory Visit: Payer: 59

## 2020-07-20 ENCOUNTER — Emergency Department (HOSPITAL_BASED_OUTPATIENT_CLINIC_OR_DEPARTMENT_OTHER)
Admission: EM | Admit: 2020-07-20 | Discharge: 2020-07-20 | Disposition: A | Discharge status: Home / Self Care | Payer: 59 | Attending: Emergency Medicine | Admitting: Emergency Medicine

## 2020-07-20 ENCOUNTER — Other Ambulatory Visit: Payer: Self-pay

## 2020-07-20 ENCOUNTER — Encounter (HOSPITAL_BASED_OUTPATIENT_CLINIC_OR_DEPARTMENT_OTHER): Payer: Self-pay | Admitting: Emergency Medicine

## 2020-07-20 DIAGNOSIS — N611 Abscess of the breast and nipple: Secondary | ICD-10-CM | POA: Diagnosis not present

## 2020-07-20 DIAGNOSIS — Z79899 Other long term (current) drug therapy: Secondary | ICD-10-CM | POA: Diagnosis not present

## 2020-07-20 DIAGNOSIS — I10 Essential (primary) hypertension: Secondary | ICD-10-CM | POA: Insufficient documentation

## 2020-07-20 MED ORDER — NAPROXEN 500 MG PO TABS: 500.0000 mg | ORAL_TABLET | Freq: Two times a day (BID) | ORAL | 0 refills | Status: DC

## 2020-07-20 MED ORDER — LIDOCAINE HCL 1 % IJ SOLN
10.0000 mL | Freq: Once | INTRAMUSCULAR | Status: AC
  Administered 2020-07-20: 10 mL via INTRADERMAL
  Filled 2020-07-20: qty 20

## 2020-07-20 MED ORDER — AMOXICILLIN-POT CLAVULANATE 875-125 MG PO TABS: 1.0000 | ORAL_TABLET | Freq: Two times a day (BID) | ORAL | 0 refills | Status: DC

## 2020-07-20 NOTE — ED Notes (Signed)
Pt had rt breast abcess needle aspiration with fluid sent to lab , pt tolerated well

## 2020-07-20 NOTE — Discharge Instructions (Addendum)
Apply warm compresses.  Take the antibiotics as prescribed.  Return to the ED for fevers or other worsening symptoms

## 2020-07-20 NOTE — ED Provider Notes (Signed)
[facility] EMERGENCY DEPARTMENT Provider Note   CSN: [id number] Arrival date & time: 07/20/20  0732     History Chief Complaint  Patient presents with  . Abscess    Hannah Harvey is a 35 y.o. female.  HPI   Patient presents to the ED for evaluation of right breast tenderness.  Patient thinks she might have an abscess.  Patient states symptoms started few days ago.  It is gradually been getting worse.  The area is red and tender to palpation.  The area that is most tender is just medial to the nipple on the right breast.  Patient has had similar problems in the past.  She has had needle aspiration and ultimately I&D.  Past Medical History:  Diagnosis Date  . Hypertension   . Kidney failure, acute (HCC)   . Obesity   . Pneumonia     Patient Active Problem List   Diagnosis Date Noted  . Lung Abscess/Multi-Focal Bilateral Pneumonia 04/20/2018  . AKI (acute kidney injury) (HCC) 04/17/2018  . RUL and LLL Pneumonia 04/17/2018  . Hypoxic Respiratory failure, acute (HCC) 04/17/2018  . Sepsis  04/17/2018  . Respiratory failure (HCC) 04/15/2018  . Breast abscess, right 10/17/2012    Past Surgical History:  Procedure Laterality Date  . CESAREAN SECTION    . CHOLECYSTECTOMY       OB History   No obstetric history on file.     Family History  Problem Relation Age of Onset  . Hyperlipidemia Mother   . Heart disease Mother   . Hyperlipidemia Father     Social History   Tobacco Use  . Smoking status: Never Smoker  . Smokeless tobacco: Never Used  Substance Use Topics  . Alcohol use: Yes    Comment: everyday  . Drug use: No    Home Medications Prior to Admission medications   Medication Sig Start Date End Date Taking? Authorizing Provider  amoxicillin-clavulanate (AUGMENTIN) 875-125 MG tablet Take 1 tablet by mouth 2 (two) times daily. 07/20/20  Yes Linwood Dibbles, MD  naproxen (NAPROSYN) 500 MG tablet Take 1 tablet (500 mg total) by mouth 2 (two)  times daily with a meal. As needed for pain 07/20/20  Yes Linwood Dibbles, MD  amLODipine (NORVASC) 10 MG tablet Take 1 tablet (10 mg total) by mouth daily. 04/23/18   Shon Hale, MD  ferrous sulfate 325 (65 FE) MG EC tablet Take 1 tablet (325 mg total) by mouth 2 (two) times daily with a meal. 04/23/18   Emokpae, Courage, MD  losartan (COZAAR) 100 MG tablet Take 1 tablet (100 mg total) by mouth daily. 04/23/18   Shon Hale, MD  Vitamin D, Ergocalciferol, (DRISDOL) 1.25 MG (50000 UT) CAPS capsule Take 50,000 Units by mouth every Monday. 03/14/18   [provider]    Allergies    Patient has no known allergies.  Review of Systems   Review of Systems  All other systems reviewed and are negative.   Physical Exam Updated Vital Signs BP 129/87 (BP Location: Right Arm)   Pulse 94   Temp 98.6 F (37 C) (Oral)   Resp 20   Ht 1.651 m (5\' 5" )   Wt (!) 152 kg   SpO2 100%   BMI 55.75 kg/m   Physical Exam Vitals and nursing note reviewed.  Constitutional:      General: She is not in acute distress.    Appearance: She is well-developed.  HENT:     Head: Normocephalic and atraumatic.  Right Ear: External ear normal.     Left Ear: External ear normal.  Eyes:     General: No scleral icterus.       Right eye: No discharge.        Left eye: No discharge.     Conjunctiva/sclera: Conjunctivae normal.  Neck:     Trachea: No tracheal deviation.  Cardiovascular:     Rate and Rhythm: Normal rate.  Pulmonary:     Effort: Pulmonary effort is normal. No respiratory distress.     Breath sounds: No stridor.  Chest:  Breasts:     Right: Swelling and tenderness present. No nipple discharge.      Comments: Area of induration with some central fluctuance right breast just medial to the nipple within the areola Abdominal:     General: There is no distension.  Musculoskeletal:        General: No swelling or deformity.     Cervical back: Neck supple.  Skin:    General: Skin is  warm and dry.     Findings: No rash.  Neurological:     Mental Status: She is alert.     Cranial Nerves: Cranial nerve deficit: no gross deficits.     ED Results / Procedures / Treatments   Labs (all labs ordered are listed, but only abnormal results are displayed) Labs Reviewed - No data to display  EKG None  Radiology No results found.  Procedures .Marland KitchenIncision and Drainage  Date/Time: 07/20/2020 8:32 AM Performed by: Linwood Dibbles, MD Authorized by: Linwood Dibbles, MD   Consent:    Consent obtained:  Verbal   Consent given by:  Patient   Risks discussed:  Bleeding, incomplete drainage, pain and damage to other organs   Alternatives discussed:  No treatment Universal protocol:    Procedure explained and questions answered to patient or proxy's satisfaction: yes     Relevant documents present and verified: yes     Test results available : yes     Imaging studies available: yes     Required blood products, implants, devices, and special equipment available: yes     Site/side marked: yes     Immediately prior to procedure, a time out was called: yes     Patient identity confirmed:  Verbally with patient Location:    Type:  Abscess   Location: Right breast. Pre-procedure details:    Skin preparation:  Betadine and chlorhexidine Sedation:    Sedation type:  None Anesthesia:    Anesthesia method:  Local infiltration   Local anesthetic:  Lidocaine 1% w/o epi Procedure type:    Complexity:  Simple Procedure details:    Ultrasound guidance: yes     Needle aspiration: yes     Needle size:  18 G   Scalpel blade:  11   Drainage:  Purulent   Drainage amount:  Moderate (5 cc)   Packing materials:  1/4 in gauze Post-procedure details:    Procedure completion:  Tolerated well, no immediate complications     Medications Ordered in ED Medications  lidocaine (XYLOCAINE) 1 % (with pres) injection 10 mL (10 mLs Intradermal Given 07/20/20 0830)    ED Course  I have reviewed the  triage vital signs and the nursing notes.  Pertinent labs & imaging results that were available during my care of the patient were reviewed by me and considered in my medical decision making (see chart for details).    MDM Rules/Calculators/A&P  Patient has a breast abscess.  Ultrasound guidance was used and a pocket of fluid was located.  5 cc of purulent material was aspirated.  We will start the patient on antibiotics.  Have her follow-up with her primary care doctor to arrange follow-up at the breast center for possible repeat ultrasound and aspiration. Final Clinical Impression(s) / ED Diagnoses Final diagnoses:  Breast abscess    Rx / DC Orders ED Discharge Orders         Ordered    amoxicillin-clavulanate (AUGMENTIN) 875-125 MG tablet  2 times daily        07/20/20 0830    naproxen (NAPROSYN) 500 MG tablet  2 times daily with meals        07/20/20 0830           Linwood Dibbles, MD 07/20/20 [phone]

## 2020-07-20 NOTE — ED Triage Notes (Signed)
Boil rt breast since sat has gotten bigger, states has had it before same place and had it drained  , tender to touch ., area is red and palm size

## 2020-07-26 LAB — AEROBIC/ANAEROBIC CULTURE W GRAM STAIN (SURGICAL/DEEP WOUND)

## 2020-07-29 ENCOUNTER — Other Ambulatory Visit: Payer: Self-pay | Admitting: Family Medicine

## 2020-07-29 DIAGNOSIS — N611 Abscess of the breast and nipple: Secondary | ICD-10-CM

## 2020-08-05 ENCOUNTER — Ambulatory Visit
Admission: RE | Admit: 2020-08-05 | Discharge: 2020-08-05 | Disposition: A | Discharge status: Home / Self Care | Payer: 59 | Source: Ambulatory Visit | Attending: Family Medicine | Admitting: Family Medicine

## 2020-08-05 ENCOUNTER — Other Ambulatory Visit: Payer: Self-pay

## 2020-08-05 ENCOUNTER — Other Ambulatory Visit: Payer: Self-pay | Admitting: Family Medicine

## 2020-08-05 DIAGNOSIS — N611 Abscess of the breast and nipple: Secondary | ICD-10-CM

## 2020-09-06 ENCOUNTER — Inpatient Hospital Stay: Admission: RE | Admit: 2020-09-06 | Payer: 59 | Source: Ambulatory Visit

## 2020-09-16 IMAGING — CT CT ABD-PELV W/ CM
3 of 14 series · 12 of 46 positions shown, 17 images · IV contrast (iopamidol)
Comparison: 04/20/2018 chest CT.

CLINICAL DATA: 32-year-old female with acute LEFT chest pain,
cough, congestion and bilateral flank pain. History of recent
multifocal pneumonia with lung abscesses.

EXAM:
CT ANGIOGRAPHY CHEST
CT ABDOMEN AND PELVIS WITH CONTRAST
TECHNIQUE: Multidetector CT imaging of the chest was performed using the
standard protocol during bolus administration of intravenous
contrast. Multiplanar CT image reconstructions and MIPs were
obtained to evaluate the vascular anatomy. Multidetector CT imaging
of the abdomen and pelvis was performed using the standard protocol
during bolus administration of intravenous contrast.
CONTRAST:  100mL [id number] IOPAMIDOL ([id number]) INJECTION 76%

[Series 8: pe thins · axial · 0.69mm/px · z∈[+182,+403]mm · 8 of 255 slices shown]
[im 17/255  soft-tissue]
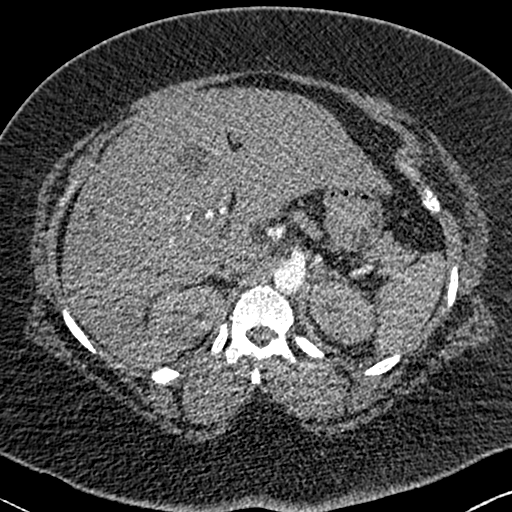
[im 51/255  soft-tissue]
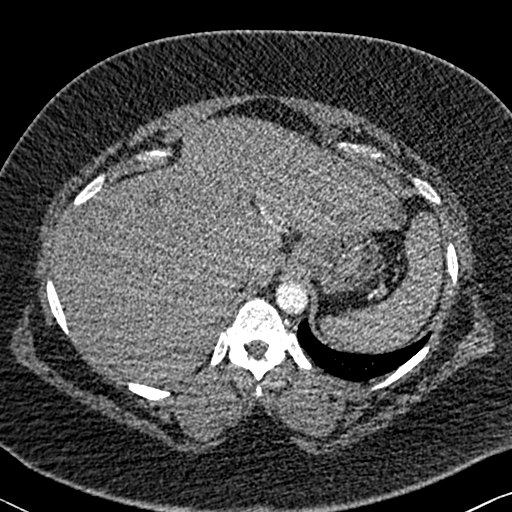
[im 85/255  soft-tissue]
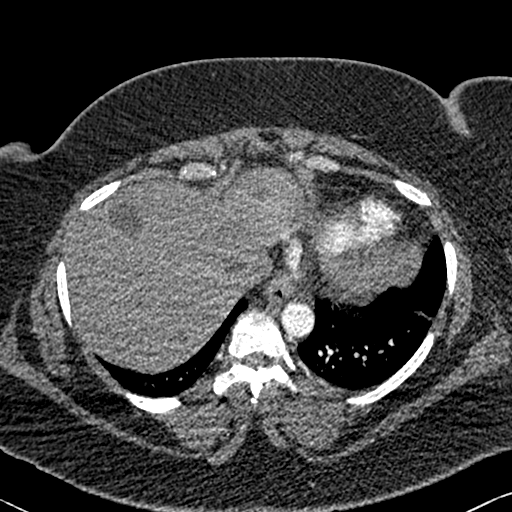
[im 119/255  soft-tissue]
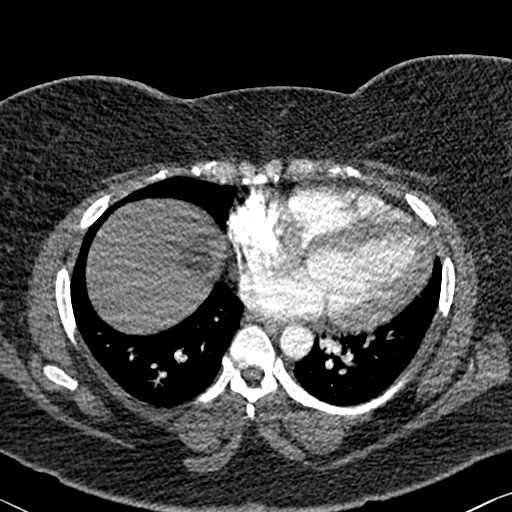
[im 136/255  soft-tissue]
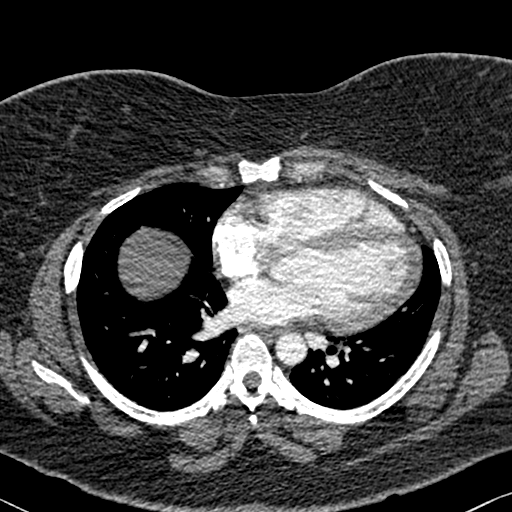
[im 170/255  soft-tissue]
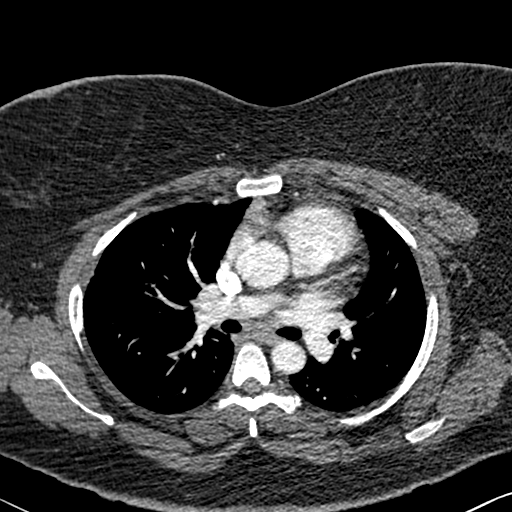
[im 204/255  soft-tissue]
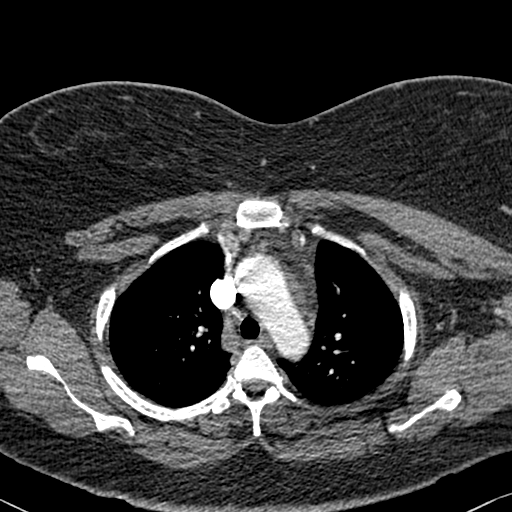
[im 238/255  soft-tissue]
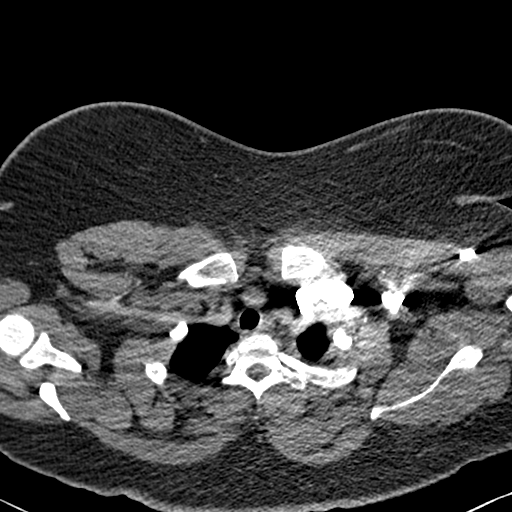

[Series 9: pe coronal mpr · coronal · 0.53mm/px · 1 of 147 slices shown, 2 images]
[im 74/147  soft-tissue]
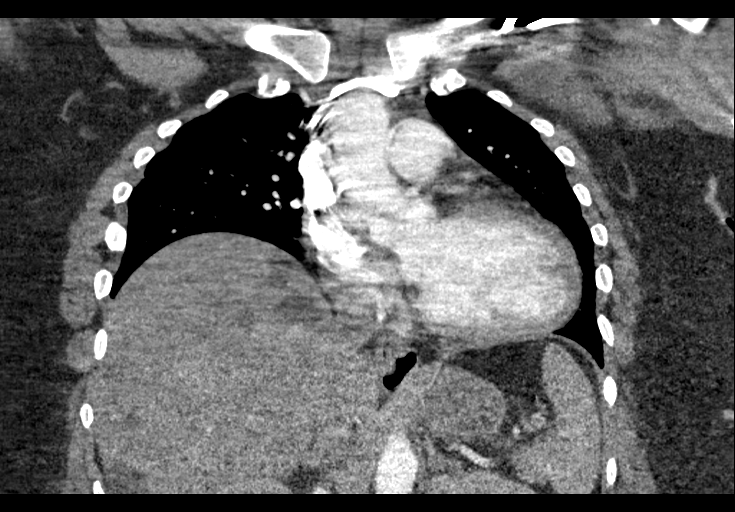
[im 74/147  bone]
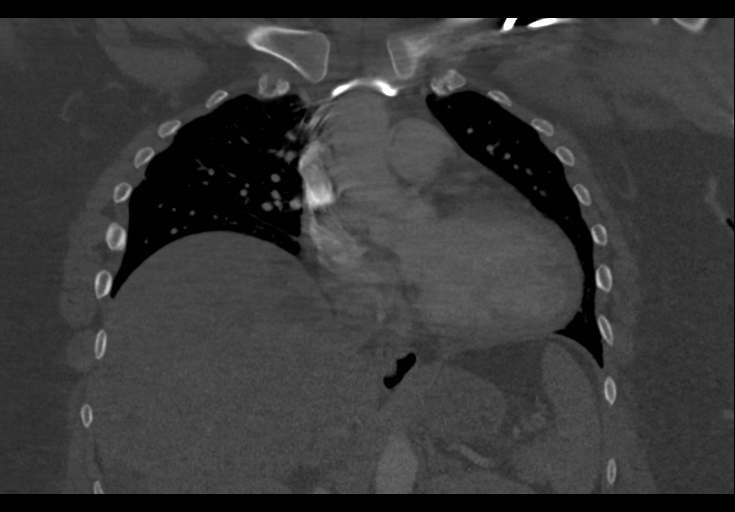

[Series 13: axial st · axial · 0.86mm/px · z∈[-84,+152]mm · 3 of 95 slices shown, 7 images]
[im 24/95  soft-tissue]
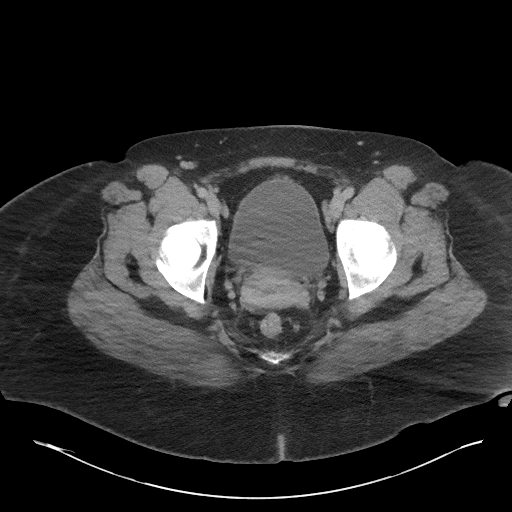
[im 24/95  lung]
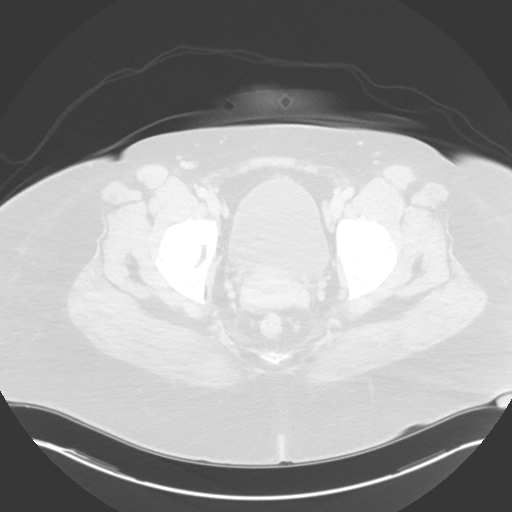
[im 24/95  bone]
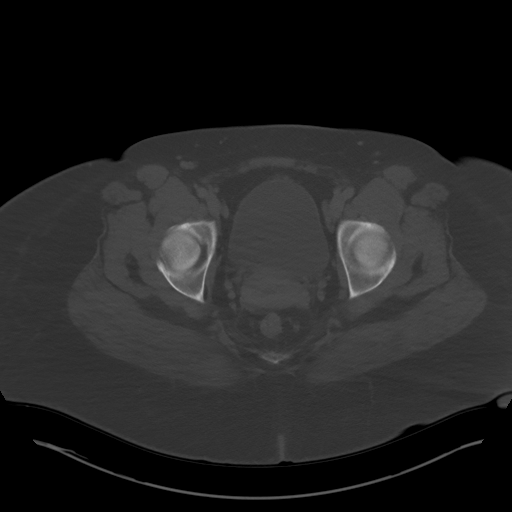
[im 48/95  soft-tissue]
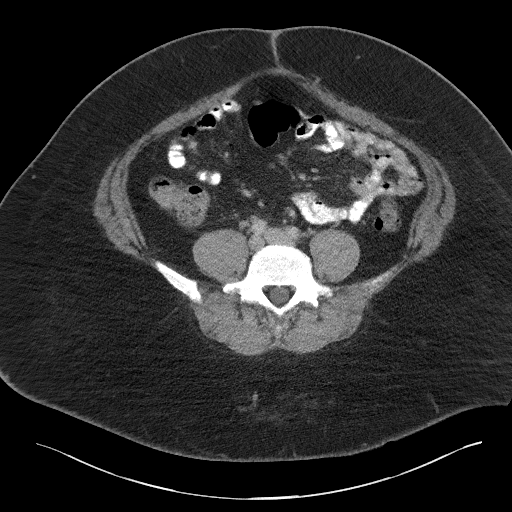
[im 48/95  lung]
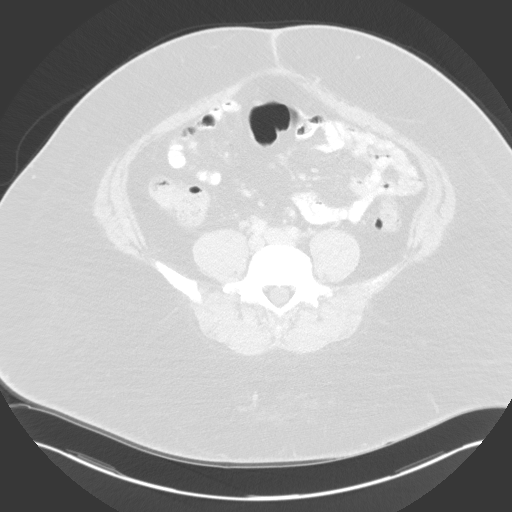
[im 71/95  soft-tissue]
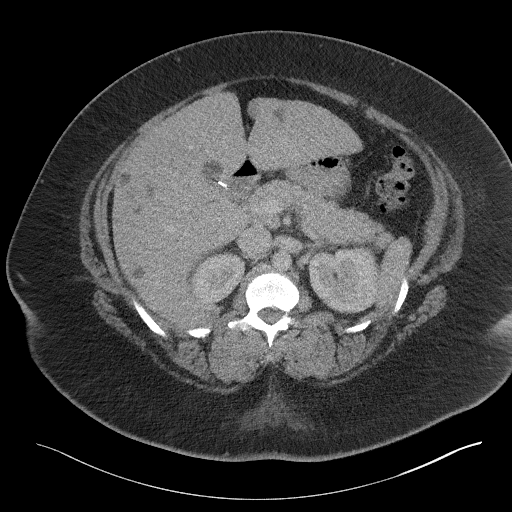
[im 71/95  lung]
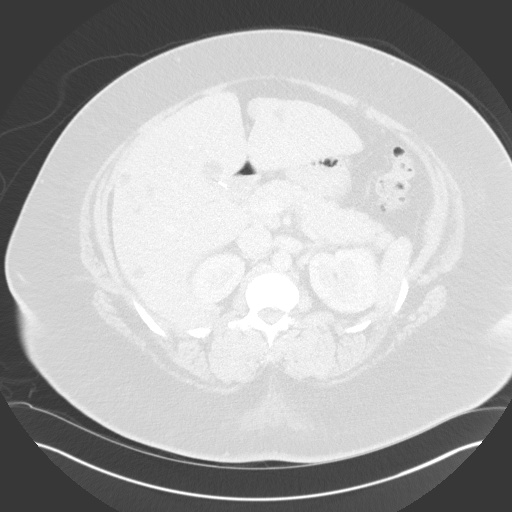

[12 of 46 positions shown; findings below may reference images not displayed]

FINDINGS: CTA CHEST FINDINGS

Cardiovascular: This is a technically borderline study due to
respiratory motion artifact and borderline contrast opacification of
the pulmonary arteries.

No definite pulmonary emboli are identified. Cardiomegaly is
present. No evidence of thoracic aortic aneurysm or pericardial
effusion.

Mediastinum/Nodes: No enlarged mediastinal, hilar, or axillary lymph
nodes. Thyroid gland, trachea, and esophagus demonstrate no
significant findings.

Lungs/Pleura: Bilateral airspace disease/consolidation identified on
the prior study has resolved except for minimal soft tissue
opacity/scarring along the RIGHT major/minor fissure.

No airspace disease, consolidation, suspicious nodule, mass, pleural
effusion or pneumothorax identified.

Musculoskeletal: No acute or suspicious bony abnormalities are
identified.

Review of the MIP images confirms the above findings.

CT ABDOMEN and PELVIS FINDINGS

Hepatobiliary: Multiple low-density/fluid density masses within the
liver are again noted, most likely representing cysts. The
visualized mid-upper hepatic lesions do not appear significantly
changed from 04/20/2018. Patient is status post cholecystectomy. No
biliary dilatation.

Pancreas: Unremarkable

Spleen: Unremarkable

Adrenals/Urinary Tract: The kidneys, adrenal glands and bladder are
unremarkable.

Stomach/Bowel: Stomach is within normal limits. Appendix appears
normal. No evidence of bowel wall thickening, distention, or
inflammatory changes.

Vascular/Lymphatic: No significant vascular findings are present. No
enlarged abdominal or pelvic lymph nodes.

Reproductive: Uterus and bilateral adnexa are unremarkable.

Other: No ascites, abscess or pneumoperitoneum.

Musculoskeletal: No acute or suspicious bony abnormalities
identified.

Review of the MIP images confirms the above findings.
IMPRESSION: 1. No evidence of acute abnormality. No evidence of pulmonary emboli
or acute abnormality within the abdomen or pelvis.
2. Resolution of bilateral airspace disease/consolidation with mild
residual RIGHT lung/pleural scarring.
3. Multiple low-density hepatic lesions again noted, most which
likely represent cysts. If there is very strong clinical suspicion
for hepatic abscess or metastatic disease, consider MRI with and
without contrast for further evaluation.
4. Cardiomegaly

## 2021-02-28 ENCOUNTER — Other Ambulatory Visit: Payer: Self-pay | Admitting: Family Medicine

## 2021-02-28 DIAGNOSIS — N611 Abscess of the breast and nipple: Secondary | ICD-10-CM

## 2021-03-02 ENCOUNTER — Other Ambulatory Visit: Payer: 59

## 2021-03-02 ENCOUNTER — Inpatient Hospital Stay: Admission: RE | Admit: 2021-03-02 | Payer: 59 | Source: Ambulatory Visit

## 2021-03-11 ENCOUNTER — Other Ambulatory Visit: Payer: Self-pay

## 2021-03-11 ENCOUNTER — Inpatient Hospital Stay: Admission: RE | Admit: 2021-03-11 | Payer: Self-pay | Source: Ambulatory Visit

## 2021-07-02 DIAGNOSIS — Z20822 Contact with and (suspected) exposure to covid-19: Secondary | ICD-10-CM | POA: Diagnosis not present

## 2021-07-04 DIAGNOSIS — J069 Acute upper respiratory infection, unspecified: Secondary | ICD-10-CM | POA: Diagnosis not present

## 2021-08-22 DIAGNOSIS — M792 Neuralgia and neuritis, unspecified: Secondary | ICD-10-CM | POA: Diagnosis not present

## 2021-08-22 DIAGNOSIS — L603 Nail dystrophy: Secondary | ICD-10-CM | POA: Diagnosis not present

## 2021-08-22 DIAGNOSIS — B351 Tinea unguium: Secondary | ICD-10-CM | POA: Diagnosis not present

## 2021-08-29 DIAGNOSIS — I10 Essential (primary) hypertension: Secondary | ICD-10-CM | POA: Diagnosis not present

## 2021-08-29 DIAGNOSIS — R7401 Elevation of levels of liver transaminase levels: Secondary | ICD-10-CM | POA: Diagnosis not present

## 2021-10-03 DIAGNOSIS — R7401 Elevation of levels of liver transaminase levels: Secondary | ICD-10-CM | POA: Diagnosis not present

## 2021-10-06 ENCOUNTER — Other Ambulatory Visit: Payer: Self-pay | Admitting: Family Medicine

## 2021-10-06 DIAGNOSIS — R748 Abnormal levels of other serum enzymes: Secondary | ICD-10-CM

## 2021-10-06 DIAGNOSIS — R7401 Elevation of levels of liver transaminase levels: Secondary | ICD-10-CM

## 2021-10-13 ENCOUNTER — Other Ambulatory Visit: Payer: Self-pay

## 2021-10-19 ENCOUNTER — Ambulatory Visit
Admission: RE | Admit: 2021-10-19 | Discharge: 2021-10-19 | Disposition: A | Discharge status: Home / Self Care | Payer: BC Managed Care – PPO | Source: Ambulatory Visit | Attending: Family Medicine | Admitting: Family Medicine

## 2021-10-19 DIAGNOSIS — R748 Abnormal levels of other serum enzymes: Secondary | ICD-10-CM

## 2021-10-19 DIAGNOSIS — R7401 Elevation of levels of liver transaminase levels: Secondary | ICD-10-CM

## 2021-11-16 ENCOUNTER — Encounter (HOSPITAL_BASED_OUTPATIENT_CLINIC_OR_DEPARTMENT_OTHER): Payer: Self-pay

## 2021-11-16 ENCOUNTER — Emergency Department (HOSPITAL_BASED_OUTPATIENT_CLINIC_OR_DEPARTMENT_OTHER)
Admission: EM | Admit: 2021-11-16 | Discharge: 2021-11-16 | Disposition: A | Discharge status: Home / Self Care | Payer: BC Managed Care – PPO | Attending: Emergency Medicine | Admitting: Emergency Medicine

## 2021-11-16 DIAGNOSIS — I10 Essential (primary) hypertension: Secondary | ICD-10-CM | POA: Insufficient documentation

## 2021-11-16 DIAGNOSIS — N390 Urinary tract infection, site not specified: Secondary | ICD-10-CM | POA: Insufficient documentation

## 2021-11-16 DIAGNOSIS — Z79899 Other long term (current) drug therapy: Secondary | ICD-10-CM | POA: Diagnosis not present

## 2021-11-16 DIAGNOSIS — R300 Dysuria: Secondary | ICD-10-CM | POA: Diagnosis not present

## 2021-11-16 LAB — URINALYSIS, ROUTINE W REFLEX MICROSCOPIC
Bilirubin Urine: NEGATIVE
Glucose, UA: NEGATIVE mg/dL
Ketones, ur: NEGATIVE mg/dL
Nitrite: NEGATIVE
Protein, ur: NEGATIVE mg/dL
Specific Gravity, Urine: 1.015 (ref 1.005–1.030)
pH: 6 (ref 5.0–8.0)

## 2021-11-16 LAB — URINALYSIS, MICROSCOPIC (REFLEX): WBC, UA: >50 WBC/hpf (ref 0–5)

## 2021-11-16 LAB — PREGNANCY, URINE: Preg Test, Ur: NEGATIVE

## 2021-11-16 MED ORDER — CEPHALEXIN 500 MG PO CAPS: 500.0000 mg | ORAL_CAPSULE | Freq: Three times a day (TID) | ORAL | 0 refills | Status: AC

## 2021-11-16 MED ORDER — PHENAZOPYRIDINE HCL 200 MG PO TABS: 200.0000 mg | ORAL_TABLET | Freq: Three times a day (TID) | ORAL | 0 refills | Status: DC

## 2021-11-16 NOTE — ED Triage Notes (Signed)
C/o burning with urination x 2 days. Denies fever/abdominal pain.

## 2021-11-16 NOTE — ED Provider Notes (Signed)
[facility] EMERGENCY DEPARTMENT Provider Note   CSN: [id number] Arrival date & time: 11/16/21  1519     History  Chief Complaint  Patient presents with   Dysuria    Hannah Harvey is a 36 y.o. female.  HPI     36 year old female with a history of hypertension, presents with concern for dysuria.  Reports that her symptoms began 2 days ago with dysuria, frequency.  Feels like she has a urinary tract infection.  Denies any fever, abdominal pain, flank pain, vaginal discharge or bleeding.  No history of prior urinary tract infection and this feels similar.  Past Medical History:  Diagnosis Date   Hypertension    Kidney failure, acute (HCC)    Obesity    Pneumonia      Home Medications Prior to Admission medications   Medication Sig Start Date End Date Taking? Authorizing Provider  cephALEXin (KEFLEX) 500 MG capsule Take 1 capsule (500 mg total) by mouth 3 (three) times daily for 7 days. 11/16/21 11/23/21 Yes Alvira Monday, MD  phenazopyridine (PYRIDIUM) 200 MG tablet Take 1 tablet (200 mg total) by mouth 3 (three) times daily. 11/16/21  Yes Alvira Monday, MD  amLODipine (NORVASC) 10 MG tablet Take 1 tablet (10 mg total) by mouth daily. 04/23/18   Shon Hale, MD  ferrous sulfate 325 (65 FE) MG EC tablet Take 1 tablet (325 mg total) by mouth 2 (two) times daily with a meal. 04/23/18   Emokpae, Courage, MD  losartan (COZAAR) 100 MG tablet Take 1 tablet (100 mg total) by mouth daily. 04/23/18   Shon Hale, MD  naproxen (NAPROSYN) 500 MG tablet Take 1 tablet (500 mg total) by mouth 2 (two) times daily with a meal. As needed for pain 07/20/20   Linwood Dibbles, MD  Vitamin D, Ergocalciferol, (DRISDOL) 1.25 MG (50000 UT) CAPS capsule Take 50,000 Units by mouth every Monday. 03/14/18   [provider]      Allergies    Patient has no known allergies.    Review of Systems   Review of Systems  Physical Exam Updated Vital Signs BP (!) 145/86 (BP  Location: Left Arm)   Pulse 87   Temp 98.4 F (36.9 C) (Oral)   Resp 18   Ht 5\' 5"  (1.651 m)   Wt (!) 141.5 kg   LMP 10/22/2021 (Exact Date)   SpO2 100%   BMI 51.92 kg/m  Physical Exam Vitals and nursing note reviewed.  Constitutional:      General: She is not in acute distress.    Appearance: She is well-developed. She is not diaphoretic.  HENT:     Head: Normocephalic and atraumatic.  Eyes:     Conjunctiva/sclera: Conjunctivae normal.  Cardiovascular:     Rate and Rhythm: Normal rate and regular rhythm.     Heart sounds: Normal heart sounds. No murmur heard.    No friction rub. No gallop.  Pulmonary:     Effort: Pulmonary effort is normal. No respiratory distress.     Breath sounds: Normal breath sounds. No wheezing or rales.  Abdominal:     General: There is no distension.     Palpations: Abdomen is soft.     Tenderness: There is no abdominal tenderness. There is no guarding.  Musculoskeletal:        General: No tenderness.     Cervical back: Normal range of motion.  Skin:    General: Skin is warm and dry.     Findings: No  erythema or rash.  Neurological:     Mental Status: She is alert and oriented to person, place, and time.     ED Results / Procedures / Treatments   Labs (all labs ordered are listed, but only abnormal results are displayed) Labs Reviewed  URINALYSIS, ROUTINE W REFLEX MICROSCOPIC - Abnormal; Notable for the following components:      Result Value   APPearance CLOUDY (*)    Hgb urine dipstick MODERATE (*)    Leukocytes,Ua LARGE (*)    All other components within normal limits  URINALYSIS, MICROSCOPIC (REFLEX) - Abnormal; Notable for the following components:   Bacteria, UA MANY (*)    All other components within normal limits  URINE CULTURE  PREGNANCY, URINE    EKG None  Radiology No results found.  Procedures Procedures    Medications Ordered in ED Medications - No data to display  ED Course/ Medical Decision Making/ A&P                            Medical Decision Making Amount and/or Complexity of Data Reviewed Labs: ordered.  Risk Prescription drug management.   36 year old female with a history of hypertension, presents with concern for dysuria.   Pregnancy test negative.  Urinalysis shows many bacteria, many white blood cells, consistent with urinary tract infection.  Denies other symptoms to suggest pelvic infection.  Denies flank pain, abdominal pain, fever, I do not see signs of pyelonephritis.  Given prescription for Keflex and pyridium. Patient discharged in stable condition with understanding of reasons to return.         Final Clinical Impression(s) / ED Diagnoses Final diagnoses:  Lower urinary tract infectious disease    Rx / DC Orders ED Discharge Orders          Ordered    phenazopyridine (PYRIDIUM) 200 MG tablet  3 times daily        11/16/21 1625    cephALEXin (KEFLEX) 500 MG capsule  3 times daily        11/16/21 1625              Alvira Monday, MD 11/18/21 1110

## 2021-11-16 NOTE — ED Notes (Signed)
Called lab to add urine culture on to previously collected specimen

## 2021-11-17 LAB — URINE CULTURE: Culture: NO GROWTH

## 2022-03-13 DIAGNOSIS — R7989 Other specified abnormal findings of blood chemistry: Secondary | ICD-10-CM | POA: Diagnosis not present

## 2022-03-13 DIAGNOSIS — I10 Essential (primary) hypertension: Secondary | ICD-10-CM | POA: Diagnosis not present

## 2022-03-13 DIAGNOSIS — N390 Urinary tract infection, site not specified: Secondary | ICD-10-CM | POA: Diagnosis not present

## 2022-04-05 DIAGNOSIS — Z113 Encounter for screening for infections with a predominantly sexual mode of transmission: Secondary | ICD-10-CM | POA: Diagnosis not present

## 2022-04-20 ENCOUNTER — Ambulatory Visit: Payer: BC Managed Care – PPO | Admitting: Podiatry

## 2022-05-01 DIAGNOSIS — Z01419 Encounter for gynecological examination (general) (routine) without abnormal findings: Secondary | ICD-10-CM | POA: Diagnosis not present

## 2022-05-01 DIAGNOSIS — N925 Other specified irregular menstruation: Secondary | ICD-10-CM | POA: Diagnosis not present

## 2022-05-29 ENCOUNTER — Encounter (HOSPITAL_BASED_OUTPATIENT_CLINIC_OR_DEPARTMENT_OTHER): Payer: Self-pay | Admitting: *Deleted

## 2022-05-29 ENCOUNTER — Emergency Department (HOSPITAL_BASED_OUTPATIENT_CLINIC_OR_DEPARTMENT_OTHER): Payer: Self-pay

## 2022-05-29 ENCOUNTER — Emergency Department (HOSPITAL_BASED_OUTPATIENT_CLINIC_OR_DEPARTMENT_OTHER)
Admission: EM | Admit: 2022-05-29 | Discharge: 2022-05-30 | Disposition: A | Discharge status: Home / Self Care | Payer: Self-pay | Attending: Emergency Medicine | Admitting: Emergency Medicine

## 2022-05-29 ENCOUNTER — Other Ambulatory Visit: Payer: Self-pay

## 2022-05-29 DIAGNOSIS — J9801 Acute bronchospasm: Secondary | ICD-10-CM | POA: Insufficient documentation

## 2022-05-29 DIAGNOSIS — J189 Pneumonia, unspecified organism: Secondary | ICD-10-CM | POA: Insufficient documentation

## 2022-05-29 DIAGNOSIS — Z20822 Contact with and (suspected) exposure to covid-19: Secondary | ICD-10-CM | POA: Insufficient documentation

## 2022-05-29 LAB — RESP PANEL BY RT-PCR (RSV, FLU A&B, COVID)  RVPGX2
Influenza A by PCR: NEGATIVE
Influenza B by PCR: NEGATIVE
Resp Syncytial Virus by PCR: NEGATIVE
SARS Coronavirus 2 by RT PCR: NEGATIVE

## 2022-05-29 LAB — GROUP A STREP BY PCR: Group A Strep by PCR: NOT DETECTED

## 2022-05-29 MED ORDER — IPRATROPIUM-ALBUTEROL 0.5-2.5 (3) MG/3ML IN SOLN
3.0000 mL | Freq: Once | RESPIRATORY_TRACT | Status: AC
  Administered 2022-05-29: 3 mL via RESPIRATORY_TRACT
  Filled 2022-05-29: qty 3

## 2022-05-29 MED ORDER — SODIUM CHLORIDE 0.9 % IV SOLN
1.0000 g | Freq: Once | INTRAVENOUS | Status: AC
  Administered 2022-05-29: 1 g via INTRAVENOUS
  Filled 2022-05-29: qty 10

## 2022-05-29 MED ORDER — DOXYCYCLINE HYCLATE 100 MG PO CAPS: 100.0000 mg | ORAL_CAPSULE | Freq: Two times a day (BID) | ORAL | 0 refills | Status: DC

## 2022-05-29 MED ORDER — AEROCHAMBER PLUS FLO-VU MEDIUM MISC
1.0000 | Freq: Once | Status: AC
  Administered 2022-05-30: 1
  Filled 2022-05-29: qty 10

## 2022-05-29 MED ORDER — ALBUTEROL SULFATE HFA 108 (90 BASE) MCG/ACT IN AERS
2.0000 | INHALATION_SPRAY | Freq: Once | RESPIRATORY_TRACT | Status: AC
  Administered 2022-05-30: 2 via RESPIRATORY_TRACT
  Filled 2022-05-29: qty 6.7

## 2022-05-29 MED ORDER — DEXAMETHASONE SODIUM PHOSPHATE 10 MG/ML IJ SOLN
10.0000 mg | Freq: Once | INTRAMUSCULAR | Status: AC
  Administered 2022-05-29: 10 mg via INTRAVENOUS
  Filled 2022-05-29: qty 1

## 2022-05-29 MED ORDER — BENZONATATE 100 MG PO CAPS: 100.0000 mg | ORAL_CAPSULE | Freq: Three times a day (TID) | ORAL | 0 refills | Status: DC

## 2022-05-29 MED ORDER — METHYLPREDNISOLONE 4 MG PO TBPK: ORAL_TABLET | ORAL | 0 refills | Status: DC

## 2022-05-29 NOTE — ED Triage Notes (Addendum)
Pt has had URI with cough and sore throat and runny nose since 05/17/2022. Pt has had post tussive emesis and fatigue.

## 2022-05-29 NOTE — ED Provider Notes (Signed)
[facility] EMERGENCY DEPARTMENT Provider Note   CSN: [id number] Arrival date & time: 05/29/22  1802     History  Chief Complaint  Patient presents with   Illness    Hannah Harvey is a 37 y.o. female URI sxs since 05/17/2022. Worsening over the last week with fever, wheezing, post tussive emesis.  She has associated vomiting. No edema, orthopnea.    Shortness of Breath Severity:  Moderate Onset quality:  Gradual Duration:  3 weeks Timing:  Intermittent Progression:  Worsening Chronicity:  Recurrent Context: URI   Relieved by:  Nothing Worsened by:  Activity and coughing Ineffective treatments:  Position changes (otc meds) Associated symptoms: cough, fever, vomiting (post tussive) and wheezing   Illness Location:  Respiratory Severity:  Moderate Onset quality:  Gradual Duration:  3 weeks Timing:  Constant Progression:  Worsening Chronicity:  Recurrent Context:  URI Relieved by:  Nothing Worsened by:  Night time, exertion, Ineffective treatments:  Otc cold meds Associated symptoms: congestion, cough, fatigue, fever, shortness of breath, vomiting (post tussive) and wheezing        Home Medications Prior to Admission medications   Medication Sig Start Date End Date Taking? Authorizing Provider  benzonatate (TESSALON) 100 MG capsule Take 1 capsule (100 mg total) by mouth every 8 (eight) hours. 05/29/22  Yes Dquan Cortopassi, PA-C  doxycycline (VIBRAMYCIN) 100 MG capsule Take 1 capsule (100 mg total) by mouth 2 (two) times daily. One po bid x 7 days 05/29/22  Yes Camylle Whicker, PA-C  methylPREDNISolone (MEDROL DOSEPAK) 4 MG TBPK tablet Use as directed 05/29/22  Yes Jermisha Hoffart, PA-C  amLODipine (NORVASC) 10 MG tablet Take 1 tablet (10 mg total) by mouth daily. 04/23/18   Roxan Hockey, MD  ferrous sulfate 325 (65 FE) MG EC tablet Take 1 tablet (325 mg total) by mouth 2 (two) times daily with a meal. 04/23/18   Emokpae, Courage, MD  losartan  (COZAAR) 100 MG tablet Take 1 tablet (100 mg total) by mouth daily. 04/23/18   Roxan Hockey, MD  naproxen (NAPROSYN) 500 MG tablet Take 1 tablet (500 mg total) by mouth 2 (two) times daily with a meal. As needed for pain 07/20/20   Dorie Rank, MD  phenazopyridine (PYRIDIUM) 200 MG tablet Take 1 tablet (200 mg total) by mouth 3 (three) times daily. 11/16/21   Gareth Morgan, MD  Vitamin D, Ergocalciferol, (DRISDOL) 1.25 MG (50000 UT) CAPS capsule Take 50,000 Units by mouth every Monday. 03/14/18   [provider]      Allergies    Patient has no known allergies.    Review of Systems   Review of Systems  Constitutional:  Positive for fatigue and fever.  HENT:  Positive for congestion.   Respiratory:  Positive for cough, shortness of breath and wheezing.   Gastrointestinal:  Positive for vomiting (post tussive).    Physical Exam Updated Vital Signs BP 125/85   Pulse 75   Temp 99.2 F (37.3 C)   Resp 20   Ht 5' 5.5" (1.664 m)   Wt (!) 137.9 kg   LMP 05/24/2022   SpO2 96%   BMI 49.82 kg/m  Physical Exam Vitals and nursing note reviewed.  Constitutional:      General: She is not in acute distress.    Appearance: She is well-developed. She is not diaphoretic.  HENT:     Head: Normocephalic and atraumatic.     Right Ear: External ear normal.     Left Ear: External ear  normal.     Nose: Nose normal.     Mouth/Throat:     Mouth: Mucous membranes are moist.  Eyes:     General: No scleral icterus.    Conjunctiva/sclera: Conjunctivae normal.  Cardiovascular:     Rate and Rhythm: Normal rate and regular rhythm.     Heart sounds: Normal heart sounds. No murmur heard.    No friction rub. No gallop.  Pulmonary:     Effort: Pulmonary effort is normal. No respiratory distress.     Breath sounds: Wheezing and rhonchi present.  Abdominal:     General: Bowel sounds are normal. There is no distension.     Palpations: Abdomen is soft. There is no mass.     Tenderness: There  is no abdominal tenderness. There is no guarding.  Musculoskeletal:     Cervical back: Normal range of motion.  Skin:    General: Skin is warm and dry.  Neurological:     Mental Status: She is alert and oriented to person, place, and time.  Psychiatric:        Behavior: Behavior normal.     ED Results / Procedures / Treatments   Labs (all labs ordered are listed, but only abnormal results are displayed) Labs Reviewed  RESP PANEL BY RT-PCR (RSV, FLU A&B, COVID)  [id number]  GROUP A STREP BY PCR    EKG None  Radiology DG Chest 2 View  Result Date: 05/29/2022 CLINICAL DATA:  Upper respiratory infection with cough and sore throat since 05/17/2022. EXAM: CHEST - 2 VIEW COMPARISON:  Chest two views 06/11/2018, chest two views 04/19/2018, AP chest 04/16/2018, AP chest 08/14/2017; CT chest 04/20/2018 FINDINGS: Cardiac silhouette and mediastinal contours are within normal limits. Mildly decreased lung volumes compared to most recent 06/11/2018 prior. Mild left-greater-than-right mid and lower lung patchy heterogeneous airspace opacities. No definite pleural effusion. No pneumothorax. Mild multilevel degenerative disc changes of the thoracic spine. Cholecystectomy clips. IMPRESSION: Mild left-greater-than-right mid and lower lung patchy heterogeneous airspace opacities. This may represent pneumonia in the appropriate clinical setting. Recommend follow-up radiographs 6 weeks after treatment to ensure resolution. Electronically Signed   By: Yvonne Kendall M.D.   On: 05/29/2022 20:20    Procedures Procedures    Medications Ordered in ED Medications  cefTRIAXone (ROCEPHIN) 1 g in sodium chloride 0.9 % 100 mL IVPB (0 g Intravenous Stopped 05/29/22 2329)  ipratropium-albuterol (DUONEB) 0.5-2.5 (3) MG/3ML nebulizer solution 3 mL (3 mLs Nebulization Given 05/29/22 2225)  dexamethasone (DECADRON) injection 10 mg (10 mg Intravenous Given 05/29/22 2234)  albuterol (VENTOLIN HFA) 108 (90 Base) MCG/ACT inhaler  2 puff (2 puffs Inhalation Given 05/30/22 0000)  AeroChamber Plus Flo-Vu Medium MISC 1 each (1 each Other Given 05/30/22 0000)    ED Course/ Medical Decision Making/ A&P                             Medical Decision Making This patient presents to the ED for concern of sob and uri sxs, this involves an extensive number of treatment options, and is a complaint that carries with it a high risk of complications and morbidity.  The emergent differential diagnosis for shortness of breath includes, but is not limited to, Pulmonary edema, bronchoconstriction, Pneumonia, Pulmonary embolism, Pneumotherax/ Hemothorax, Dysrythmia, ACS.        Co morbidities that complicate the patient evaluation       obesity   Additional history obtained:  {Additional history obtained  from previous EMR Outside records reviewed including pulmonology notes    Lab Tests:  I Ordered, and personally interpreted labs.  The pertinent results include:    Labs reviewed showed negative respiratory panel, Negative group A strep   Imaging Studies ordered:  I ordered imaging studies including two-view chest x-ray I independently visualized and interpreted imaging which showed right greater than left lower lung airspace opacities I agree with the radiologist interpretation   Cardiac Monitoring/ECG:       The patient was maintained on a cardiac monitor.  I personally viewed and interpreted the cardiac monitored which showed an underlying rhythm of: Normal sinus rhythm      Medicines ordered and prescription drug management:  I ordered medication including Medications cefTRIAXone (ROCEPHIN) 1 g in sodium chloride 0.9 % 100 mL IVPB (0 g Intravenous Stopped 05/29/22 2329) ipratropium-albuterol (DUONEB) 0.5-2.5 (3) MG/3ML nebulizer solution 3 mL (3 mLs Nebulization Given 05/29/22 2225) dexamethasone (DECADRON) injection 10 mg (10 mg Intravenous Given 05/29/22 2234) albuterol (VENTOLIN HFA) 108 (90 Base)  MCG/ACT inhaler 2 puff (2 puffs Inhalation Given 05/30/22 0000) AeroChamber Plus Flo-Vu Medium MISC 1 each (1 each Other Given 05/30/22 0000) for shortness of breath and wheezing Reevaluation of the patient after these medicines showed that the patient improved I have reviewed the patients home medicines and have made adjustments as needed   Test Considered:       CT angio of the chest however have extremely low suspicion for pulmonary embolus   Critical Interventions:          Consultations Obtained:      Problem List / ED Course:       (J18.9) Community acquired pneumonia, unspecified laterality  (primary encounter diagnosis)  (J98.01) Bronchospasm     Reevaluation:  After the interventions noted above, I reevaluated the patient and found that they have :improved   Social Determinants of Health:       Outpatient follow-up and established relationship with pulmonology   Dispostion:  After consideration of the diagnostic results and the patients response to treatment, I feel that the patent would benefit from discharge.  After review of all data points patient appears to have community-acquired pneumonia.  She was treated here with albuterol, dexamethasone, and IV Rocephin.  Will discharge with Medrol taper, albuterol inhaler with spacer, and doxycycline.  Outpatient follow-up and return precautions discussed including repeat x-ray    Amount and/or Complexity of Data Reviewed Radiology: ordered.  Risk Prescription drug management.           Final Clinical Impression(s) / ED Diagnoses Final diagnoses:  Community acquired pneumonia, unspecified laterality  Bronchospasm    Rx / DC Orders ED Discharge Orders          Ordered    doxycycline (VIBRAMYCIN) 100 MG capsule  2 times daily        05/29/22 2351    benzonatate (TESSALON) 100 MG capsule  Every 8 hours        05/29/22 2351    methylPREDNISolone (MEDROL DOSEPAK) 4 MG TBPK tablet         05/29/22 2351              Arthor Captain, PA-C 05/30/22 1027    Lonell Grandchild, MD 05/31/22 [phone]

## 2022-05-29 NOTE — Discharge Instructions (Addendum)
Get help right away if: Your shortness of breath becomes worse. Your chest pain increases. Your sickness becomes worse, especially if you are an older adult or have a weak immune system. You cough up blood. These symptoms may be an emergency. Get help right away. Call 911. Do not wait to see if the symptoms will go away. Do not drive yourself to the hospital.

## 2022-06-19 ENCOUNTER — Other Ambulatory Visit: Payer: Self-pay

## 2022-06-19 ENCOUNTER — Emergency Department (HOSPITAL_BASED_OUTPATIENT_CLINIC_OR_DEPARTMENT_OTHER): Payer: Self-pay

## 2022-06-19 ENCOUNTER — Encounter (HOSPITAL_BASED_OUTPATIENT_CLINIC_OR_DEPARTMENT_OTHER): Payer: Self-pay

## 2022-06-19 ENCOUNTER — Emergency Department (HOSPITAL_BASED_OUTPATIENT_CLINIC_OR_DEPARTMENT_OTHER)
Admission: EM | Admit: 2022-06-19 | Discharge: 2022-06-19 | Disposition: A | Discharge status: Home / Self Care | Payer: Self-pay | Attending: Emergency Medicine | Admitting: Emergency Medicine

## 2022-06-19 DIAGNOSIS — Z1152 Encounter for screening for COVID-19: Secondary | ICD-10-CM | POA: Insufficient documentation

## 2022-06-19 DIAGNOSIS — N3001 Acute cystitis with hematuria: Secondary | ICD-10-CM | POA: Insufficient documentation

## 2022-06-19 DIAGNOSIS — I10 Essential (primary) hypertension: Secondary | ICD-10-CM | POA: Insufficient documentation

## 2022-06-19 DIAGNOSIS — R052 Subacute cough: Secondary | ICD-10-CM | POA: Insufficient documentation

## 2022-06-19 LAB — URINALYSIS, MICROSCOPIC (REFLEX)
RBC / HPF: NONE SEEN RBC/hpf (ref 0–5)
WBC, UA: >50 WBC/hpf (ref 0–5)

## 2022-06-19 LAB — RESP PANEL BY RT-PCR (RSV, FLU A&B, COVID)  RVPGX2
Influenza A by PCR: NEGATIVE
Influenza B by PCR: NEGATIVE
Resp Syncytial Virus by PCR: NEGATIVE
SARS Coronavirus 2 by RT PCR: NEGATIVE

## 2022-06-19 LAB — URINALYSIS, ROUTINE W REFLEX MICROSCOPIC
Bilirubin Urine: NEGATIVE
Glucose, UA: NEGATIVE mg/dL
Ketones, ur: NEGATIVE mg/dL
Nitrite: NEGATIVE
Protein, ur: NEGATIVE mg/dL
Specific Gravity, Urine: <=1.005 (ref 1.005–1.030)
pH: 5.5 (ref 5.0–8.0)

## 2022-06-19 MED ORDER — CEPHALEXIN 500 MG PO CAPS: 500.0000 mg | ORAL_CAPSULE | Freq: Four times a day (QID) | ORAL | 0 refills | Status: AC

## 2022-06-19 MED ORDER — HYDROCODONE BIT-HOMATROP MBR 5-1.5 MG/5ML PO SOLN: 5.0000 mL | Freq: Four times a day (QID) | ORAL | 0 refills | Status: DC | PRN

## 2022-06-19 NOTE — Discharge Instructions (Addendum)
Your workup today is overall reassuring.  Chest x-ray shows improvement findings from recent pneumonia.  You likely have postinfectious cough.  However this is disruptive to your daily activities I have sent in the Hycodan.  This does have narcotic pain medicine so be cautious after you take this as it may make you drowsy.  Do not drive.  He also have a UTI.  I have sent antibiotics into the pharmacy for you.  This antibiotic should also cover in case she had a pneumonia which I have low suspicion for.  However if you develop worsening symptoms such as fever, shortness of breath, worsening cough please return for evaluation.

## 2022-06-19 NOTE — ED Triage Notes (Signed)
T c/o cough since 1/3, seen for same 1/15 & dx w pneumonia. Pt states she also thinks she has a UTI- burning w urination. Denies fevers, additional symptoms

## 2022-06-19 NOTE — ED Provider Notes (Addendum)
[facility] Provider Note   CSN: [id number] Arrival date & time: 06/19/22  1733     History  Chief Complaint  Patient presents with   Cough   Urinary Tract Infection    Hannah Harvey is a 37 y.o. female.  37 year old female presents today for evaluation of persistent cough, and dysuria for the past 2 days.  She states she has had a cough since January 3 and on January 15 she was diagnosed with pneumonia.  She states she has completed the antibiotics however she is having worsening coughing spells.  No fevers, no shortness of breath, no chest pain.  She states dysuria started 2 days ago.  No flank pain.  The history is provided by the patient. No language interpreter was used.       Home Medications Prior to Admission medications   Medication Sig Start Date End Date Taking? Authorizing Provider  amLODipine (NORVASC) 10 MG tablet Take 1 tablet (10 mg total) by mouth daily. 04/23/18   Roxan Hockey, MD  benzonatate (TESSALON) 100 MG capsule Take 1 capsule (100 mg total) by mouth every 8 (eight) hours. 05/29/22   Margarita Mail, PA-C  doxycycline (VIBRAMYCIN) 100 MG capsule Take 1 capsule (100 mg total) by mouth 2 (two) times daily. One po bid x 7 days 05/29/22   Margarita Mail, PA-C  ferrous sulfate 325 (65 FE) MG EC tablet Take 1 tablet (325 mg total) by mouth 2 (two) times daily with a meal. 04/23/18   Emokpae, Courage, MD  losartan (COZAAR) 100 MG tablet Take 1 tablet (100 mg total) by mouth daily. 04/23/18   Roxan Hockey, MD  methylPREDNISolone (MEDROL DOSEPAK) 4 MG TBPK tablet Use as directed 05/29/22   Margarita Mail, PA-C  naproxen (NAPROSYN) 500 MG tablet Take 1 tablet (500 mg total) by mouth 2 (two) times daily with a meal. As needed for pain 07/20/20   Dorie Rank, MD  phenazopyridine (PYRIDIUM) 200 MG tablet Take 1 tablet (200 mg total) by mouth 3 (three) times daily. 11/16/21   Gareth Morgan, MD  Vitamin D,  Ergocalciferol, (DRISDOL) 1.25 MG (50000 UT) CAPS capsule Take 50,000 Units by mouth every Monday. 03/14/18   [provider]      Allergies    Patient has no known allergies.    Review of Systems   Review of Systems  Constitutional:  Negative for chills and fever.  HENT:  Negative for congestion, sore throat, trouble swallowing and voice change.   Respiratory:  Positive for cough. Negative for shortness of breath.   Gastrointestinal:  Negative for abdominal pain and vomiting.  Genitourinary:  Positive for dysuria. Negative for flank pain and frequency.  Neurological:  Negative for headaches.  All other systems reviewed and are negative.   Physical Exam Updated Vital Signs BP 122/87 (BP Location: Left Arm)   Pulse (!) 117   Temp 98.2 F (36.8 C) (Oral)   Resp 18   Ht 5' 5.5" (1.664 m)   Wt 136.1 kg   LMP 05/24/2022   SpO2 100%   BMI 49.16 kg/m  Physical Exam Vitals and nursing note reviewed.  Constitutional:      General: She is not in acute distress.    Appearance: Normal appearance. She is not ill-appearing.  HENT:     Head: Normocephalic and atraumatic.     Nose: Nose normal.  Eyes:     General: No scleral icterus.    Extraocular Movements: Extraocular movements  intact.     Conjunctiva/sclera: Conjunctivae normal.  Cardiovascular:     Rate and Rhythm: Regular rhythm. Tachycardia present.     Pulses: Normal pulses.  Pulmonary:     Effort: Pulmonary effort is normal. No respiratory distress.     Breath sounds: Normal breath sounds. No wheezing or rales.  Abdominal:     General: There is no distension.     Palpations: Abdomen is soft.     Tenderness: There is no abdominal tenderness. There is no right CVA tenderness, left CVA tenderness or guarding.  Musculoskeletal:        General: Normal range of motion.     Cervical back: Normal range of motion.  Skin:    General: Skin is warm and dry.  Neurological:     General: No focal deficit present.      Mental Status: She is alert. Mental status is at baseline.     ED Results / Procedures / Treatments   Labs (all labs ordered are listed, but only abnormal results are displayed) Labs Reviewed  URINALYSIS, ROUTINE W REFLEX MICROSCOPIC - Abnormal; Notable for the following components:      Result Value   APPearance CLOUDY (*)    Hgb urine dipstick TRACE (*)    Leukocytes,Ua LARGE (*)    All other components within normal limits  URINALYSIS, MICROSCOPIC (REFLEX) - Abnormal; Notable for the following components:   Bacteria, UA FEW (*)    All other components within normal limits  RESP PANEL BY RT-PCR (RSV, FLU A&B, COVID)  RVPGX2    EKG None  Radiology DG Chest 2 View  Result Date: 06/19/2022 CLINICAL DATA:  Cough. EXAM: CHEST - 2 VIEW COMPARISON:  Chest x-ray May 29, 2022 FINDINGS: The cardiomediastinal silhouette is normal. No pneumothorax. The right lung is clear. Minimal haziness in the left base on the frontal view only likely represents overlapping soft tissues versus resolving opacity identified on previous x-ray imaging. No new or suspicious focal infiltrates identified. IMPRESSION: Minimal haziness in the left base on the frontal view only likely represents overlapping soft tissues versus resolving opacity identified on previous x-ray imaging. No new abnormalities. Electronically Signed   By: Dorise Bullion III M.D.   On: 06/19/2022 17:58    Procedures Procedures    Medications Ordered in ED Medications - No data to display  ED Course/ Medical Decision Making/ A&P                             Medical Decision Making Amount and/or Complexity of Data Reviewed Labs: ordered. Radiology: ordered.  Risk Prescription drug management.   Medical Decision Making / ED Course   This patient presents to the ED for concern of cough, dysuria, this involves an extensive number of treatment options, and is a complaint that carries with it a high risk of complications and  morbidity.  The differential diagnosis includes pneumonia, viral URI, postinfectious cough, UTI, pyelonephritis  MDM: 37 year old female presents with above-mentioned symptoms.  Overall she is well-appearing without acute distress.  Was recently diagnosed with pneumonia and completed antibiotic course.  Without fever, or shortness of breath.  Reports coughing spells which are worse when she is working.  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  X-ray was obtained which shows improving pneumonia from 1/15.  No evidence of new findings.  Likely postinfectious cough.  UA with evidence of UTI.  Given concomitant UTI  will start patient on antibiotics.  This should be sufficient to cover both UTI and any pneumonia however low suspicion for pneumonia.  100% O2 sats on room air.  Without tachypnea.  Afebrile.  Will provide Hycodan prescription given cough is restrictive to her work.  Respiratory panel negative.  Patient is appropriate for discharge.  Discharged in stable condition.  Return precautions discussed.  Patient voices understanding and is in agreement with plan.  Without CVA tenderness.  Low suspicion for pyelonephritis.   Lab Tests: -I ordered, reviewed, and interpreted labs.   The pertinent results include:   Labs Reviewed  URINALYSIS, ROUTINE W REFLEX MICROSCOPIC - Abnormal; Notable for the following components:      Result Value   APPearance CLOUDY (*)    Hgb urine dipstick TRACE (*)    Leukocytes,Ua LARGE (*)    All other components within normal limits  URINALYSIS, MICROSCOPIC (REFLEX) - Abnormal; Notable for the following components:   Bacteria, UA FEW (*)    All other components within normal limits  RESP PANEL BY RT-PCR (RSV, FLU A&B, COVID)  RVPGX2      EKG  EKG Interpretation  Date/Time:    Ventricular Rate:    PR Interval:    QRS Duration:   QT Interval:    QTC Calculation:   R Axis:     Text Interpretation:           Imaging Studies  ordered: I ordered imaging studies including chest x-ray I independently visualized and interpreted imaging. I agree with the radiologist interpretation   Medicines ordered and prescription drug management: No orders of the defined types were placed in this encounter.   -I have reviewed the patients home medicines and have made adjustments as needed   Reevaluation: After the interventions noted above, I reevaluated the patient and found that they have :stayed the same  Co morbidities that complicate the patient evaluation  Past Medical History:  Diagnosis Date   Hypertension    Kidney failure, acute ([place])    Obesity    Pneumonia       Dispostion: Patient appropriate for discharge.  Discharged in stable condition.  Admission considered however normal O2 sats no acute distress.  She is appropriate for discharge.  Discussed follow-up with PCP.  Return precautions discussed.  Final Clinical Impression(s) / ED Diagnoses Final diagnoses:  Subacute cough  Acute cystitis with hematuria    Rx / DC Orders ED Discharge Orders          Ordered    cephALEXin (KEFLEX) 500 MG capsule  4 times daily        06/19/22 1836    HYDROcodone bit-homatropine (HYCODAN) 5-1.5 MG/5ML syrup  Every 6 hours PRN        06/19/22 1836              Evlyn Courier, PA-C 06/19/22 1837    Evlyn Courier, PA-C 06/19/22 1842    Lajean Saver, MD 06/19/22 2308

## 2022-11-11 IMAGING — US US BREAST*R* LIMITED INC AXILLA
1 series · 6 of 6 positions shown · non-contrast
Comparison: None.

CLINICAL DATA: 34-year-old female presenting for evaluation a right
breast abscess. She went to [REDACTED]. An aspiration
was formed and a small amount of purulent fluid was aspirated. The
patient was put on antibiotics which she has completed and states
that the pain and the large palpable area has considerably decreased
in size. Just today, the site started draining.

EXAM:
ULTRASOUND OF THE RIGHT BREAST

[Series 1: us breast*right* limited inc axilla · 0.07mm/px · 6 of 6 slices shown]
[im 1/6]
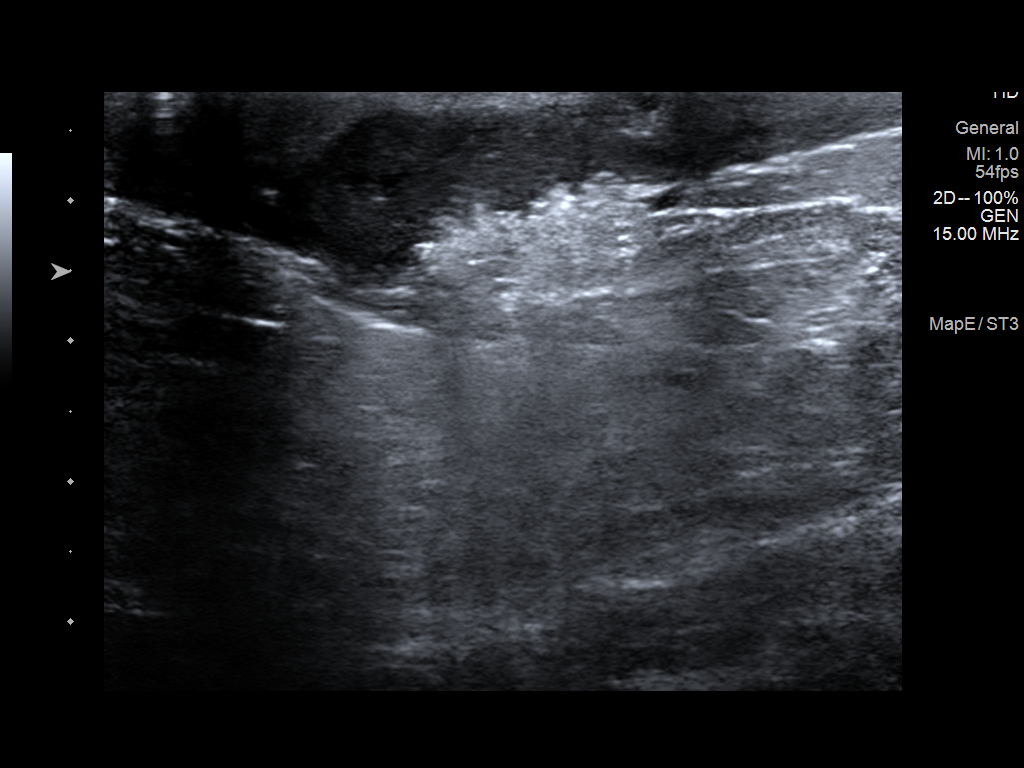
[im 2/6]
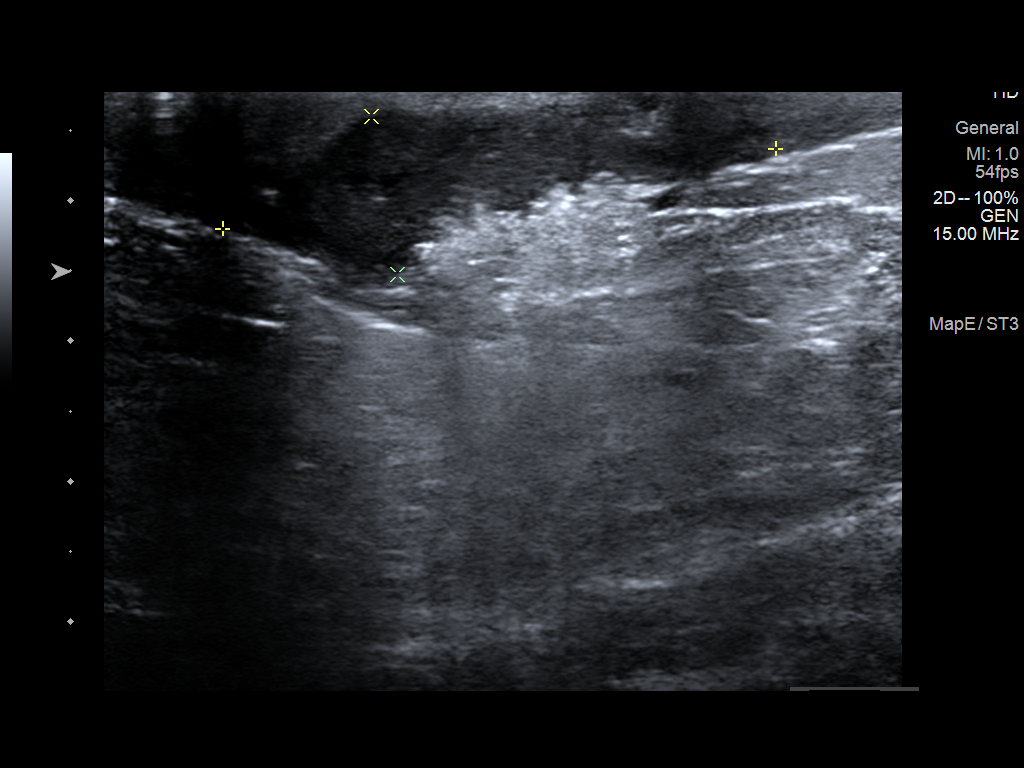
[im 3/6]
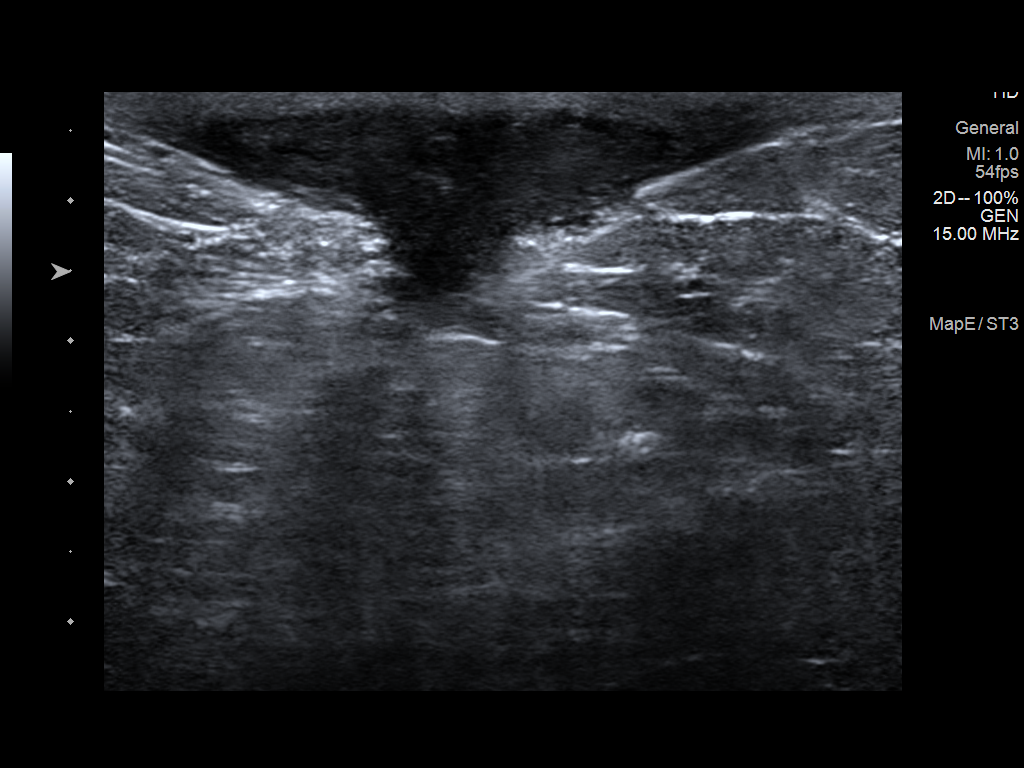
[im 4/6]
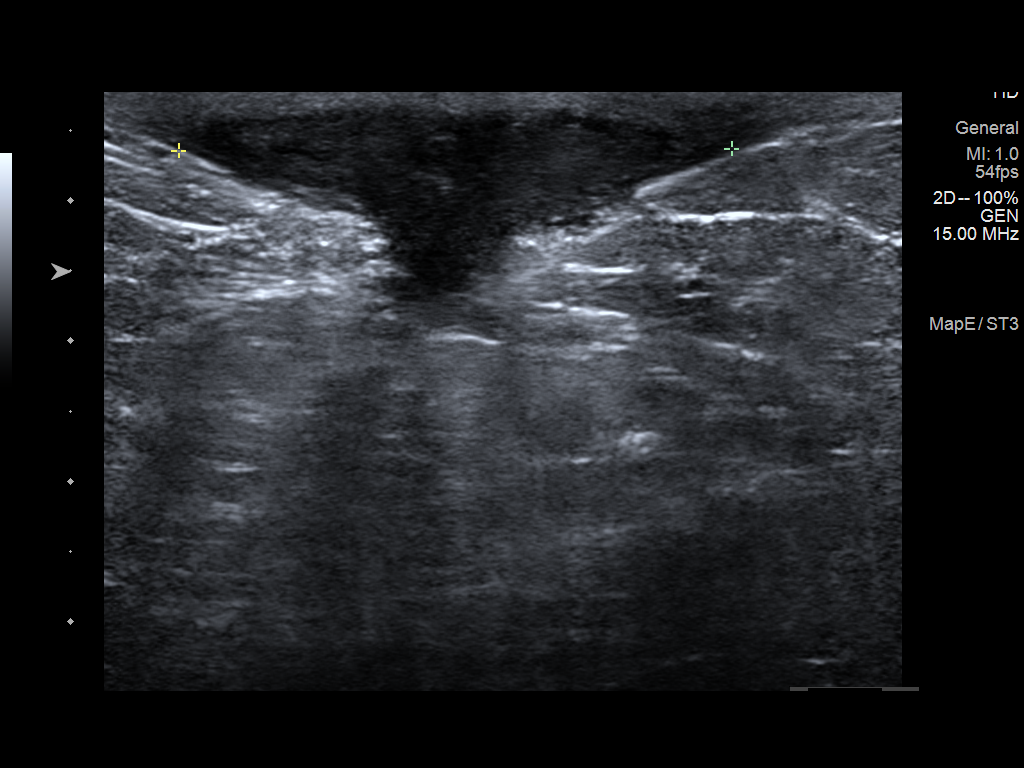
[im 5/6]
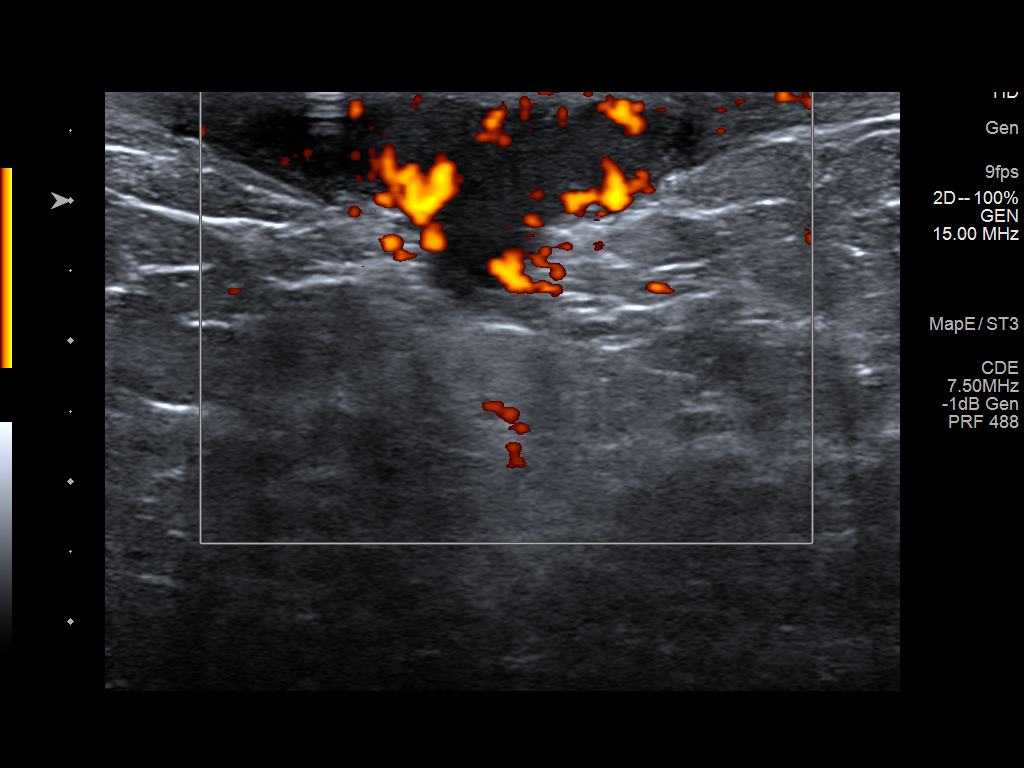
[im 6/6]
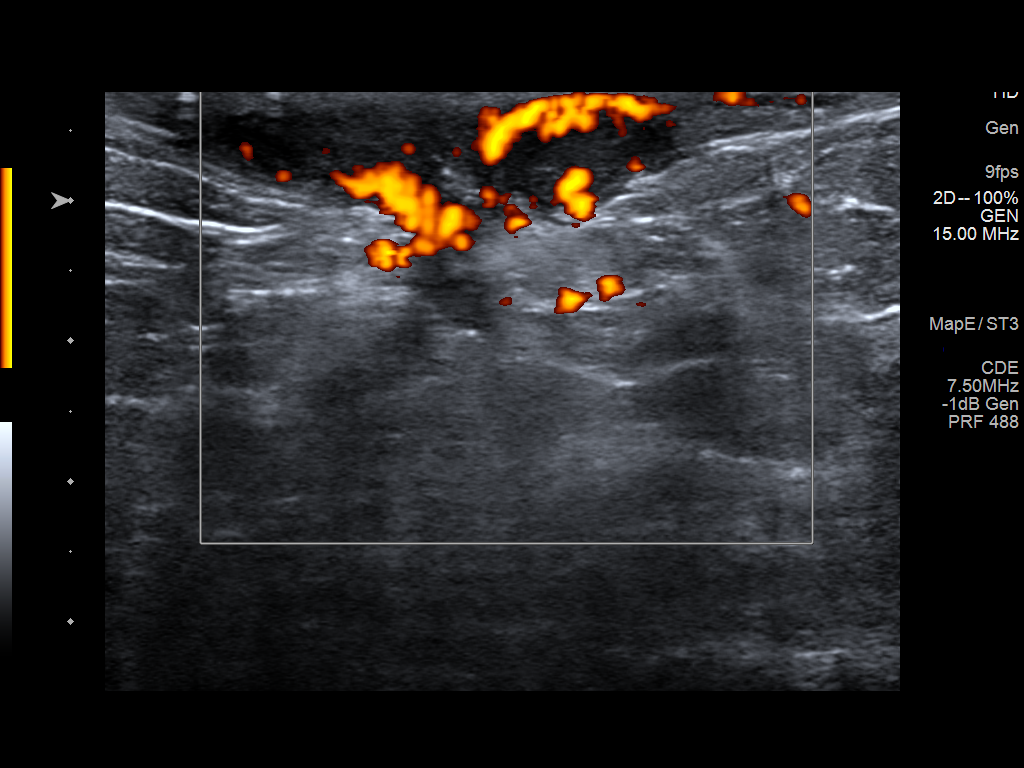

[6 of 6 positions shown; findings below may reference images not displayed]

FINDINGS: On physical exam, there is a firm superficial palpable area with
some darkened discoloration medial to the right nipple involving the
areola and the medial breast. There is an opening within this region
and a small amount of cloudy liquid can be expressed.

Targeted ultrasound is performed, showing a superficial hypoechoic
area within the skin extending slightly into the breast tissue
measuring 4.0 x 1.1 x 3.9 cm. Some mobile debris can be seen within
this region, however there is blood flow within much of the mass.
This likely represents a combination of fluid as well as granulation
tissue.
IMPRESSION: There is a 4.0 cm superficial resolving abscess in the medial right
breast. This constitutes a combination of mobile fluid as well as
granulation tissue. As the site is now draining on its own, no
procedure will be performed today.

RECOMMENDATION:
One month follow-up right breast ultrasound is recommended. In the
meantime, daily warm compresses to the site to encourage drainage
was recommended as well as keeping the area clean and dry. If any
infectious symptoms such as increased palpable lump, redness, heat
or pain recur, she should be seen immediately and be given another
course of antibiotics, rather than wait for the 1 month follow-up
recommendation.

I have discussed the findings and recommendations with the patient.
If applicable, a reminder letter will be sent to the patient
regarding the next appointment.

BI-RADS CATEGORY  3: Probably benign.

## 2023-04-18 ENCOUNTER — Other Ambulatory Visit: Payer: Self-pay

## 2023-04-18 ENCOUNTER — Emergency Department (HOSPITAL_BASED_OUTPATIENT_CLINIC_OR_DEPARTMENT_OTHER)
Admission: EM | Admit: 2023-04-18 | Discharge: 2023-04-18 | Disposition: A | Discharge status: Home / Self Care | Payer: Medicaid Other | Attending: Emergency Medicine | Admitting: Emergency Medicine

## 2023-04-18 DIAGNOSIS — I10 Essential (primary) hypertension: Secondary | ICD-10-CM | POA: Insufficient documentation

## 2023-04-18 DIAGNOSIS — Z760 Encounter for issue of repeat prescription: Secondary | ICD-10-CM | POA: Insufficient documentation

## 2023-04-18 DIAGNOSIS — Z79899 Other long term (current) drug therapy: Secondary | ICD-10-CM | POA: Insufficient documentation

## 2023-04-18 DIAGNOSIS — N3000 Acute cystitis without hematuria: Secondary | ICD-10-CM | POA: Insufficient documentation

## 2023-04-18 LAB — URINALYSIS, ROUTINE W REFLEX MICROSCOPIC
Bilirubin Urine: NEGATIVE
Glucose, UA: NEGATIVE mg/dL
Ketones, ur: NEGATIVE mg/dL
Nitrite: NEGATIVE
Protein, ur: 100 mg/dL — AB
Specific Gravity, Urine: >=1.03 (ref 1.005–1.030)
pH: 5.5 (ref 5.0–8.0)

## 2023-04-18 LAB — URINALYSIS, MICROSCOPIC (REFLEX): RBC / HPF: >50 RBC/hpf (ref 0–5)

## 2023-04-18 MED ORDER — LOSARTAN POTASSIUM-HCTZ 100-25 MG PO TABS: 1.0000 | ORAL_TABLET | Freq: Every day | ORAL | 1 refills | Status: DC

## 2023-04-18 MED ORDER — CEFADROXIL 500 MG PO CAPS: 500.0000 mg | ORAL_CAPSULE | Freq: Two times a day (BID) | ORAL | 0 refills | Status: AC

## 2023-04-18 NOTE — ED Provider Notes (Signed)
[place] Provider Note   CSN: [id number] Arrival date & time: 04/18/23  1639     History  Chief Complaint  Patient presents with   Dysuria    Hannah Harvey is a 37 y.o. female.  Patient with history of hypertension and obesity presents today with complaints of dysuria.  She states that same feels like pressure in her lower pelvic region when she urinates and has been persistent for the last few days.  She states same feels like UTIs she has had previously.  She denies any fevers, nausea, vomiting, diarrhea, abdominal pain, or flank pain.  No vaginal discharge or concern for STIs, states she just recently had negative STD testing.  Denies any concern for pregnancy as well.  Also notes that she has been out of her blood pressure medication for the last few days.  She takes losartan-hydrochlorothiazide 100-25.  She is requesting a refill.  She denies any headaches, vision changes, chest pain, shortness of breath.  The history is provided by the patient. No language interpreter was used.  Dysuria      Home Medications Prior to Admission medications   Medication Sig Start Date End Date Taking? Authorizing Provider  HYDROcodone bit-homatropine (HYCODAN) 5-1.5 MG/5ML syrup Take 5 mLs by mouth every 6 (six) hours as needed for cough. 06/19/22  Yes Ali, Amjad, PA-C  losartan (COZAAR) 100 MG tablet Take 1 tablet (100 mg total) by mouth daily. 04/23/18  Yes Shon Hale, MD  methylPREDNISolone (MEDROL DOSEPAK) 4 MG TBPK tablet Use as directed 05/29/22  Yes Harris, Abigail, PA-C  amLODipine (NORVASC) 10 MG tablet Take 1 tablet (10 mg total) by mouth daily. 04/23/18   Shon Hale, MD  benzonatate (TESSALON) 100 MG capsule Take 1 capsule (100 mg total) by mouth every 8 (eight) hours. 05/29/22   Arthor Captain, PA-C  doxycycline (VIBRAMYCIN) 100 MG capsule Take 1 capsule (100 mg total) by mouth 2 (two) times daily. One po bid x 7 days  05/29/22   Arthor Captain, PA-C  ferrous sulfate 325 (65 FE) MG EC tablet Take 1 tablet (325 mg total) by mouth 2 (two) times daily with a meal. 04/23/18   Emokpae, Courage, MD  naproxen (NAPROSYN) 500 MG tablet Take 1 tablet (500 mg total) by mouth 2 (two) times daily with a meal. As needed for pain 07/20/20   Linwood Dibbles, MD  phenazopyridine (PYRIDIUM) 200 MG tablet Take 1 tablet (200 mg total) by mouth 3 (three) times daily. 11/16/21   Alvira Monday, MD  Vitamin D, Ergocalciferol, (DRISDOL) 1.25 MG (50000 UT) CAPS capsule Take 50,000 Units by mouth every Monday. 03/14/18   [provider]      Allergies    Patient has no known allergies.    Review of Systems   Review of Systems  Genitourinary:  Positive for dysuria.  All other systems reviewed and are negative.   Physical Exam Updated Vital Signs BP (!) 155/83   Pulse 85   Temp 98.4 F (36.9 C)   Resp 20   Ht 5\' 5"  (1.651 m)   Wt (!) 145.2 kg   LMP 04/15/2023 (Exact Date)   SpO2 99%   BMI 53.25 kg/m  Physical Exam Vitals and nursing note reviewed.  Constitutional:      General: She is not in acute distress.    Appearance: Normal appearance. She is normal weight. She is not ill-appearing, toxic-appearing or diaphoretic.  HENT:     Head: Normocephalic  and atraumatic.  Cardiovascular:     Rate and Rhythm: Normal rate.  Pulmonary:     Effort: Pulmonary effort is normal. No respiratory distress.  Abdominal:     General: Abdomen is flat.     Palpations: Abdomen is soft.     Tenderness: There is no abdominal tenderness.  Musculoskeletal:        General: Normal range of motion.     Cervical back: Normal range of motion.  Skin:    General: Skin is warm and dry.  Neurological:     General: No focal deficit present.     Mental Status: She is alert.  Psychiatric:        Mood and Affect: Mood normal.        Behavior: Behavior normal.     ED Results / Procedures / Treatments   Labs (all labs ordered are listed,  but only abnormal results are displayed) Labs Reviewed  URINALYSIS, ROUTINE W REFLEX MICROSCOPIC - Abnormal; Notable for the following components:      Result Value   Color, Urine RED (*)    APPearance CLOUDY (*)    Hgb urine dipstick LARGE (*)    Protein, ur 100 (*)    Leukocytes,Ua SMALL (*)    All other components within normal limits  URINALYSIS, MICROSCOPIC (REFLEX) - Abnormal; Notable for the following components:   Bacteria, UA FEW (*)    All other components within normal limits    EKG None  Radiology No results found.  Procedures Procedures    Medications Ordered in ED Medications - No data to display  ED Course/ Medical Decision Making/ A&P                                 Medical Decision Making Amount and/or Complexity of Data Reviewed Labs: ordered.   This patient is a 37 y.o. female  who presents to the ED for concern of dysuria, medication refill.   Differential diagnoses prior to evaluation: The emergent differential diagnosis includes, but is not limited to, UTI, nephrolithiasis, pyelonephritis. This is not an exhaustive differential.   Past Medical History / Co-morbidities / Social History:  has a past medical history of Hypertension, Kidney failure, acute ([facility]), Obesity, and Pneumonia.  Additional history: Chart reviewed. Pertinent results include: Previously diagnosed with UTIs, treated with Keflex.  Physical Exam: Physical exam performed. The pertinent findings include: Well-appearing, abdomen soft and nontender.  Lab Tests/Imaging studies: I personally interpreted labs and the pertinent results include: UA infectious.   Disposition: After consideration of the diagnostic results and the patients response to treatment, I feel that emergency department workup does not suggest an emergent condition requiring admission or immediate intervention beyond what has been performed at this time. The plan is: Discharge with [facility] for UTI and refill of  her home blood pressure medication.  Recommend close PCP follow-up as well. Evaluation and diagnostic testing in the emergency department does not suggest an emergent condition requiring admission or immediate intervention beyond what has been performed at this time.  Plan for discharge with close PCP follow-up.  Patient is understanding and amenable with plan, educated on red flag symptoms that would prompt immediate return.  Patient discharged in stable condition.  Final Clinical Impression(s) / ED Diagnoses Final diagnoses:  Acute cystitis without hematuria  Medication refill    Rx / DC Orders ED Discharge Orders  Ordered    losartan-hydrochlorothiazide (HYZAAR) 100-25 MG tablet  Daily        04/18/23 1913    cefadroxil (DURICEF) 500 MG capsule  2 times daily        04/18/23 1913          An After Visit Summary was printed and given to the patient.     Silva Bandy, PA-C 04/18/23 1919    Elayne Snare K, DO 04/18/23 2316

## 2023-04-18 NOTE — Discharge Instructions (Addendum)
As we discussed, you do have a UTI today.  I have given you a prescription for Duricef which is an antibiotic for you to take as prescribed in its entirety for management of this.  I have also refilled your home blood pressure medication.  Please take this as prescribed every day.  Please follow-up closely with your primary doctor for long-term management of your chronic health problems.  Return if development of any new or worsening symptoms.

## 2023-04-18 NOTE — ED Triage Notes (Signed)
Pt reports that she thinks that she has a UTI. Also states that she hasn't taken her BP meds x 2 weeks. No longer has a PCP and needs a refill.

## 2023-04-25 ENCOUNTER — Encounter (HOSPITAL_BASED_OUTPATIENT_CLINIC_OR_DEPARTMENT_OTHER): Payer: Self-pay | Admitting: Urology

## 2023-04-25 ENCOUNTER — Other Ambulatory Visit: Payer: Self-pay

## 2023-04-25 ENCOUNTER — Emergency Department (HOSPITAL_BASED_OUTPATIENT_CLINIC_OR_DEPARTMENT_OTHER)
Admission: EM | Admit: 2023-04-25 | Discharge: 2023-04-25 | Disposition: A | Discharge status: Home / Self Care | Payer: Self-pay | Attending: Emergency Medicine | Admitting: Emergency Medicine

## 2023-04-25 DIAGNOSIS — I10 Essential (primary) hypertension: Secondary | ICD-10-CM | POA: Insufficient documentation

## 2023-04-25 DIAGNOSIS — Z79899 Other long term (current) drug therapy: Secondary | ICD-10-CM | POA: Insufficient documentation

## 2023-04-25 DIAGNOSIS — R300 Dysuria: Secondary | ICD-10-CM | POA: Insufficient documentation

## 2023-04-25 LAB — URINALYSIS, ROUTINE W REFLEX MICROSCOPIC
Bilirubin Urine: NEGATIVE
Glucose, UA: NEGATIVE mg/dL
Ketones, ur: NEGATIVE mg/dL
Nitrite: NEGATIVE
Protein, ur: NEGATIVE mg/dL
Specific Gravity, Urine: 1.02 (ref 1.005–1.030)
pH: 5.5 (ref 5.0–8.0)

## 2023-04-25 LAB — URINALYSIS, MICROSCOPIC (REFLEX)

## 2023-04-25 LAB — PREGNANCY, URINE: Preg Test, Ur: NEGATIVE

## 2023-04-25 MED ORDER — PHENAZOPYRIDINE HCL 200 MG PO TABS: 200.0000 mg | ORAL_TABLET | Freq: Three times a day (TID) | ORAL | 0 refills | Status: DC

## 2023-04-25 NOTE — ED Triage Notes (Signed)
Pt seen on 12/4 for UTI and given rx cefadroxil  States symptoms worsening, states burning with urination, frequency and urgency

## 2023-04-25 NOTE — Discharge Instructions (Signed)
Please follow-up with your OB/GYN in regards recent symptoms and ER visit.  Today your urine has improved from your previous visit and the antibiotics seem to be working however I understand you are still having discomfort and so I have prescribed you some medications to help with your discomfort.  If symptoms change or worsen please return to the ER.

## 2023-04-25 NOTE — ED Provider Notes (Signed)
[place] [facility] Provider Note   CSN: [id number] Arrival date & time: 04/25/23  1640     History  Chief Complaint  Patient presents with   Dysuria    Hannah Harvey is a 37 y.o. female history of AKI, UTIs, hypertension presented with dysuria for the past week.  Patient was seen a week ago and diagnosed with UTI and has been taking cefadroxil but states she still having dysuria with frequency and urgency.  Patient denies fevers, abdominal pain, nauseous vomiting, vaginal discharge, hematuria or other changes since being seen a week ago.  Patient been taking antibiotics without missing a dose but has been unable to see her OB/GYN in the meantime.  Patient wants to have her urine retested.  Home Medications Prior to Admission medications   Medication Sig Start Date End Date Taking? Authorizing Provider  phenazopyridine (PYRIDIUM) 200 MG tablet Take 1 tablet (200 mg total) by mouth 3 (three) times daily. 04/25/23  Yes Evlyn Kanner T, PA-C  amLODipine (NORVASC) 10 MG tablet Take 1 tablet (10 mg total) by mouth daily. 04/23/18   Shon Hale, MD  benzonatate (TESSALON) 100 MG capsule Take 1 capsule (100 mg total) by mouth every 8 (eight) hours. 05/29/22   Arthor Captain, PA-C  cefadroxil (DURICEF) 500 MG capsule Take 1 capsule (500 mg total) by mouth 2 (two) times daily for 7 days. 04/18/23 04/25/23  Smoot, Shawn Route, PA-C  doxycycline (VIBRAMYCIN) 100 MG capsule Take 1 capsule (100 mg total) by mouth 2 (two) times daily. One po bid x 7 days 05/29/22   Arthor Captain, PA-C  ferrous sulfate 325 (65 FE) MG EC tablet Take 1 tablet (325 mg total) by mouth 2 (two) times daily with a meal. 04/23/18   Emokpae, Courage, MD  HYDROcodone bit-homatropine (HYCODAN) 5-1.5 MG/5ML syrup Take 5 mLs by mouth every 6 (six) hours as needed for cough. 06/19/22   Karie Mainland, Amjad, PA-C  losartan (COZAAR) 100 MG tablet Take 1 tablet (100 mg total) by mouth daily. 04/23/18    Shon Hale, MD  losartan-hydrochlorothiazide (HYZAAR) 100-25 MG tablet Take 1 tablet by mouth daily. 04/18/23 06/17/23  Smoot, Shawn Route, PA-C  methylPREDNISolone (MEDROL DOSEPAK) 4 MG TBPK tablet Use as directed 05/29/22   Arthor Captain, PA-C  naproxen (NAPROSYN) 500 MG tablet Take 1 tablet (500 mg total) by mouth 2 (two) times daily with a meal. As needed for pain 07/20/20   Linwood Dibbles, MD  Vitamin D, Ergocalciferol, (DRISDOL) 1.25 MG (50000 UT) CAPS capsule Take 50,000 Units by mouth every Monday. 03/14/18   [provider]      Allergies    Patient has no known allergies.    Review of Systems   Review of Systems  Genitourinary:  Positive for dysuria.    Physical Exam Updated Vital Signs BP (!) 134/104 (BP Location: Left Arm)   Pulse 98   Temp 98 F (36.7 C) (Oral)   Resp 16   Ht 5\' 5"  (1.651 m)   Wt (!) 145 kg   LMP 04/15/2023 (Exact Date)   SpO2 99%   BMI 53.20 kg/m  Physical Exam Vitals reviewed.  Constitutional:      General: She is not in acute distress. HENT:     Head: Normocephalic and atraumatic.  Eyes:     Extraocular Movements: Extraocular movements intact.     Conjunctiva/sclera: Conjunctivae normal.     Pupils: Pupils are equal, round, and reactive to light.  Cardiovascular:  Rate and Rhythm: Normal rate and regular rhythm.     Pulses: Normal pulses.     Heart sounds: Normal heart sounds.     Comments: 2+ bilateral radial/dorsalis pedis pulses with regular rate Pulmonary:     Effort: Pulmonary effort is normal. No respiratory distress.     Breath sounds: Normal breath sounds.  Abdominal:     Palpations: Abdomen is soft.     Tenderness: There is no abdominal tenderness. There is no right CVA tenderness, left CVA tenderness, guarding or rebound.  Musculoskeletal:        General: Normal range of motion.     Cervical back: Normal range of motion and neck supple.     Comments: 5 out of 5 bilateral grip/leg extension strength  Skin:     General: Skin is warm and dry.     Capillary Refill: Capillary refill takes less than 2 seconds.  Neurological:     General: No focal deficit present.     Mental Status: She is alert and oriented to person, place, and time.     Comments: Sensation intact in all 4 limbs  Psychiatric:        Mood and Affect: Mood normal.     ED Results / Procedures / Treatments   Labs (all labs ordered are listed, but only abnormal results are displayed) Labs Reviewed  URINALYSIS, ROUTINE W REFLEX MICROSCOPIC - Abnormal; Notable for the following components:      Result Value   Hgb urine dipstick TRACE (*)    Leukocytes,Ua TRACE (*)    All other components within normal limits  URINALYSIS, MICROSCOPIC (REFLEX) - Abnormal; Notable for the following components:   Bacteria, UA FEW (*)    All other components within normal limits  URINE CULTURE  PREGNANCY, URINE    EKG None  Radiology No results found.  Procedures Procedures    Medications Ordered in ED Medications - No data to display  ED Course/ Medical Decision Making/ A&P                                 Medical Decision Making Amount and/or Complexity of Data Reviewed Labs: ordered.  Risk Prescription drug management.   Hannah Harvey 37 y.o. presented today for dysuria. Working DDx that I considered at this time includes, but not limited to, UTI, pyelonephritis, urosepsis, STI.  R/o DDx: UTI, pyelonephritis, urosepsis, STI: These are considered less likely due to history of present illness, physical exam, labs/imaging findings  Review of prior external notes: 04/18/2023 ED  Unique Tests and My Interpretation:  UA: Few bacteria, improved from a week ago Urine culture: Pending Urine pregnancy: Negative  Social Determinants of Health: none  Discussion with Independent Historian: None  Discussion of Management of Tests: None  Risk: Medium: prescription drug management  Risk Stratification Score:  None  Plan: On exam patient was in no acute distress stable vitals.  Patient visit was unremarkable and patient denied endorsing any red flag symptoms on the past week.  Urine does seem improved from a week ago and so it looks like the UTI is improving.  I discussed with the patient the possibility of this being may be STI related and offered swabs however patient declined she states this is a UTI.  Patient not having flank pain on exam or endorsing systemic symptoms that indicate further workup at this time.  I spoke to the patient about this  and patient is in agreement and that she does not need to have any labs or further workup done.  We agreed to prescribe the patient Azo to help with her symptoms and want her to follow-up with her OB/GYN.  Patient was given return precautions. Patient stable for discharge at this time.  Patient verbalized understanding of plan.  This chart was dictated using voice recognition software.  Despite best efforts to proofread,  errors can occur which can change the documentation meaning.         Final Clinical Impression(s) / ED Diagnoses Final diagnoses:  Dysuria    Rx / DC Orders ED Discharge Orders          Ordered    phenazopyridine (PYRIDIUM) 200 MG tablet  3 times daily        04/25/23 1741              Netta Corrigan, PA-C 04/25/23 1746    Sloan Leiter, DO 04/28/23 [phone]

## 2023-04-26 LAB — URINE CULTURE: Culture: <10000 — AB

## 2023-04-27 ENCOUNTER — Emergency Department (HOSPITAL_BASED_OUTPATIENT_CLINIC_OR_DEPARTMENT_OTHER)
Admission: EM | Admit: 2023-04-27 | Discharge: 2023-04-27 | Disposition: A | Discharge status: Home / Self Care | Payer: Self-pay | Attending: Emergency Medicine | Admitting: Emergency Medicine

## 2023-04-27 ENCOUNTER — Encounter (HOSPITAL_BASED_OUTPATIENT_CLINIC_OR_DEPARTMENT_OTHER): Payer: Self-pay

## 2023-04-27 ENCOUNTER — Other Ambulatory Visit: Payer: Self-pay

## 2023-04-27 DIAGNOSIS — R300 Dysuria: Secondary | ICD-10-CM | POA: Insufficient documentation

## 2023-04-27 LAB — URINALYSIS, ROUTINE W REFLEX MICROSCOPIC
Glucose, UA: 100 mg/dL — AB
Ketones, ur: NEGATIVE mg/dL
Nitrite: POSITIVE — AB
Protein, ur: 100 mg/dL — AB
Specific Gravity, Urine: 1.02 (ref 1.005–1.030)
pH: 5 (ref 5.0–8.0)

## 2023-04-27 LAB — URINALYSIS, MICROSCOPIC (REFLEX)

## 2023-04-27 MED ORDER — CEPHALEXIN 500 MG PO CAPS: 500.0000 mg | ORAL_CAPSULE | Freq: Four times a day (QID) | ORAL | 0 refills | Status: DC

## 2023-04-27 NOTE — ED Provider Notes (Signed)
[place] [facility] Provider Note   CSN: [id number] Arrival date & time: 04/27/23  1059     History  Chief Complaint  Patient presents with   Dysuria    Hannah Harvey is a 37 y.o. female.  She is here with ongoing dysuria for about a week.  She was treated with a cephalosporin which made her symptoms worse.  Now she is having burning.  Was seen again and treated with Pyridium.  No improvement.  She is asking for Keflex.  She denies any bleeding or any chance of pregnancy.  She said she usually gets UTIs about twice a year.  No fevers or chills.  The history is provided by the patient.  Dysuria Pain quality:  Burning Onset quality:  Gradual Duration:  1 week Progression:  Worsening Chronicity:  Recurrent Urinary symptoms: frequent urination   Risk factors: not pregnant        Home Medications Prior to Admission medications   Medication Sig Start Date End Date Taking? Authorizing Provider  amLODipine (NORVASC) 10 MG tablet Take 1 tablet (10 mg total) by mouth daily. 04/23/18   Shon Hale, MD  benzonatate (TESSALON) 100 MG capsule Take 1 capsule (100 mg total) by mouth every 8 (eight) hours. 05/29/22   Arthor Captain, PA-C  doxycycline (VIBRAMYCIN) 100 MG capsule Take 1 capsule (100 mg total) by mouth 2 (two) times daily. One po bid x 7 days 05/29/22   Arthor Captain, PA-C  ferrous sulfate 325 (65 FE) MG EC tablet Take 1 tablet (325 mg total) by mouth 2 (two) times daily with a meal. 04/23/18   Emokpae, Courage, MD  HYDROcodone bit-homatropine (HYCODAN) 5-1.5 MG/5ML syrup Take 5 mLs by mouth every 6 (six) hours as needed for cough. 06/19/22   Karie Mainland, Amjad, PA-C  losartan (COZAAR) 100 MG tablet Take 1 tablet (100 mg total) by mouth daily. 04/23/18   Shon Hale, MD  losartan-hydrochlorothiazide (HYZAAR) 100-25 MG tablet Take 1 tablet by mouth daily. 04/18/23 06/17/23  Smoot, Shawn Route, PA-C  methylPREDNISolone (MEDROL DOSEPAK) 4 MG  TBPK tablet Use as directed 05/29/22   Arthor Captain, PA-C  naproxen (NAPROSYN) 500 MG tablet Take 1 tablet (500 mg total) by mouth 2 (two) times daily with a meal. As needed for pain 07/20/20   Linwood Dibbles, MD  phenazopyridine (PYRIDIUM) 200 MG tablet Take 1 tablet (200 mg total) by mouth 3 (three) times daily. 04/25/23   Netta Corrigan, PA-C  Vitamin D, Ergocalciferol, (DRISDOL) 1.25 MG (50000 UT) CAPS capsule Take 50,000 Units by mouth every Monday. 03/14/18   [provider]      Allergies    Patient has no known allergies.    Review of Systems   Review of Systems  Genitourinary:  Positive for dysuria.    Physical Exam Updated Vital Signs BP (!) 174/115   Pulse 94   Temp 97.8 F (36.6 C) (Oral)   Resp 16   Ht 5\' 5"  (1.651 m)   Wt (!) 145 kg   LMP 04/15/2023 (Exact Date)   SpO2 100%   BMI 53.20 kg/m  Physical Exam Constitutional:      Appearance: Normal appearance. She is well-developed.  HENT:     Head: Normocephalic and atraumatic.  Eyes:     Conjunctiva/sclera: Conjunctivae normal.  Abdominal:     Tenderness: There is no abdominal tenderness.  Musculoskeletal:     Cervical back: Neck supple.  Skin:    General: Skin is  warm and dry.  Neurological:     Mental Status: She is alert.     GCS: GCS eye subscore is 4. GCS verbal subscore is 5. GCS motor subscore is 6.     ED Results / Procedures / Treatments   Labs (all labs ordered are listed, but only abnormal results are displayed) Labs Reviewed  URINALYSIS, ROUTINE W REFLEX MICROSCOPIC - Abnormal; Notable for the following components:      Result Value   Color, Urine ORANGE (*)    Glucose, UA 100 (*)    Hgb urine dipstick TRACE (*)    Bilirubin Urine SMALL (*)    Protein, ur 100 (*)    Nitrite POSITIVE (*)    Leukocytes,Ua LARGE (*)    All other components within normal limits  URINALYSIS, MICROSCOPIC (REFLEX) - Abnormal; Notable for the following components:   Bacteria, UA MANY (*)    All  other components within normal limits  URINE CULTURE    EKG None  Radiology No results found.  Procedures Procedures    Medications Ordered in ED Medications - No data to display  ED Course/ Medical Decision Making/ A&P                                 Medical Decision Making Amount and/or Complexity of Data Reviewed Labs: ordered.  Risk Prescription drug management.   This patient complains of dysuria; this involves an extensive number of treatment Options and is a complaint that carries with it a high risk of complications and morbidity. The differential includes UTI, vaginitis, pyelonephritis, cystitis  I ordered, reviewed and interpreted labs, which included urinalysis with possible signs of infection Previous records obtained and reviewed in epic, multiple ED visits for similar presentation.  Last urine cultures were negative Social determinants considered, no significant barriers Critical Interventions: None  After the interventions stated above, I reevaluated the patient and found patient to be well-appearing in no distress Admission and further testing considered, will cover her with Keflex and sent urine for culture.  Recommended close follow-up with her treatment team.  Return instructions discussed         Final Clinical Impression(s) / ED Diagnoses Final diagnoses:  Dysuria    Rx / DC Orders ED Discharge Orders          Ordered    cephALEXin (KEFLEX) 500 MG capsule  4 times daily        04/27/23 1159              Terrilee Files, MD 04/27/23 1745

## 2023-04-27 NOTE — Discharge Instructions (Signed)
Please stop your current antibiotic and start on the cephalexin.  Drink plenty of fluids.  Follow-up with your primary care doctor.

## 2023-04-27 NOTE — ED Triage Notes (Signed)
The patient has had burning and pain when urinating for two weeks. She stated the antibiotics she was given are not helping on 12/11.

## 2023-04-30 LAB — URINE CULTURE: Culture: 30000 — AB

## 2023-05-01 ENCOUNTER — Telehealth (HOSPITAL_BASED_OUTPATIENT_CLINIC_OR_DEPARTMENT_OTHER): Payer: Self-pay | Admitting: *Deleted

## 2023-05-01 NOTE — Telephone Encounter (Signed)
Post ED Visit - Positive Culture Follow-up: Successful Patient Follow-Up  Culture assessed and recommendations reviewed by:  []  Enzo Bi, Pharm.D. []  Celedonio Miyamoto, Pharm.D., BCPS AQ-ID []  Garvin Fila, Pharm.D., BCPS []  Georgina Pillion, Pharm.D., BCPS []  [place], [place].D., BCPS, AAHIVP []  Estella Husk, Pharm.D., BCPS, AAHIVP []  Lysle Pearl, PharmD, BCPS []  Phillips Climes, PharmD, BCPS []  Agapito Games, PharmD, BCPS []  Verlan Friends, PharmD  Positive urine culture  []  Patient discharged without antimicrobial prescription and treatment is now indicated [x]  Organism is resistant to prescribed ED discharge antimicrobial []  Patient with positive blood cultures  Changes discussed with ED provider: Valrie Hart, PA-C New antibiotic prescription Macrobid 100 mg BID x 5 days Called to [facility] [phone]  /Contacted patient, date 05/01/2023, time 0800   Lysle Pearl 05/01/2023, 8:12 AM

## 2023-05-01 NOTE — Progress Notes (Signed)
ED Antimicrobial Stewardship Positive Culture Follow Up   Hannah Harvey is an 37 y.o. female who presented to [facility] on 04/27/2023 with a chief complaint of  Chief Complaint  Patient presents with   Dysuria    Recent Results (from the past 720 hours)  Urine Culture     Status: Abnormal   Collection Time: 04/25/23  4:52 PM   Specimen: Urine, Clean Catch  Result Value Ref Range Status   Specimen Description   Final    URINE, CLEAN CATCH Performed at [facility], [address] [place], [place] [address]    Special Requests   Final    NONE Performed at [facility], [address] [place], [place] [address]    Culture (A)  Final    <10,000 COLONIES/mL INSIGNIFICANT GROWTH Performed at [facility], [address], [place], [place] [address]    Report Status 04/26/2023 FINAL  Final  Urine Culture     Status: Abnormal   Collection Time: 04/27/23 11:00 AM   Specimen: Urine, Clean Catch  Result Value Ref Range Status   Specimen Description   Final    URINE, CLEAN CATCH Performed at [facility], [address] [place], [place] [address]    Special Requests   Final    NONE Performed at [facility], [address] [place], [place] [address]    Culture 30,000 COLONIES/mL ENTEROCOCCUS FAECALIS (A)  Final   Report Status 04/30/2023 FINAL  Final   Organism ID, Bacteria ENTEROCOCCUS FAECALIS (A)  Final      Susceptibility   Enterococcus faecalis - MIC*    AMPICILLIN <=2 SENSITIVE Sensitive     NITROFURANTOIN <=16 SENSITIVE Sensitive     VANCOMYCIN 2 SENSITIVE Sensitive     * 30,000 COLONIES/mL ENTEROCOCCUS FAECALIS    [x]  Treated with Keflex, organism resistant to prescribed antimicrobial []  Patient discharged originally without antimicrobial agent and treatment is now indicated  New antibiotic prescription: Macrobid  ED Provider: Valrie Hart, PA-C   Daylene Posey 05/01/2023, 7:32 AM Clinical  Pharmacist Monday - Friday phone -  [phone] Saturday - Sunday phone - [phone]

## 2023-05-23 ENCOUNTER — Telehealth: Payer: Self-pay

## 2023-05-23 NOTE — Telephone Encounter (Signed)
 Spoke with patient and got her scheduled for a new patient appointment. Her last pap was done in 2023 at [facility] with Dr. Dickey Reese off of [facility].   Mailed patient ROI to fill out and return prior to appointment so that we can have those records. Patient informed to send filled out form to us  via mychart or bring in person.

## 2023-07-06 ENCOUNTER — Encounter: Payer: Self-pay | Admitting: Obstetrics and Gynecology

## 2023-07-06 ENCOUNTER — Ambulatory Visit: Payer: Medicaid Other | Admitting: Obstetrics and Gynecology

## 2023-07-06 VITALS — BP 138/85 | HR 79 | Ht 65.5 in | Wt 330.0 lb

## 2023-07-06 DIAGNOSIS — Z01419 Encounter for gynecological examination (general) (routine) without abnormal findings: Secondary | ICD-10-CM | POA: Diagnosis not present

## 2023-07-06 DIAGNOSIS — I10 Essential (primary) hypertension: Secondary | ICD-10-CM

## 2023-07-06 DIAGNOSIS — Z1231 Encounter for screening mammogram for malignant neoplasm of breast: Secondary | ICD-10-CM

## 2023-07-06 DIAGNOSIS — Z23 Encounter for immunization: Secondary | ICD-10-CM | POA: Diagnosis not present

## 2023-07-06 MED ORDER — LOSARTAN POTASSIUM-HCTZ 100-25 MG PO TABS: 1.0000 | ORAL_TABLET | Freq: Every day | ORAL | 1 refills | Status: DC

## 2023-07-06 NOTE — Progress Notes (Signed)
ANNUAL EXAM Patient name: Hannah Harvey MRN [id number]  Date of birth: 11-07-1985 Chief Complaint:   No chief complaint on file.  History of Present Illness:   Hannah Harvey is a 38 y.o. [phone] being seen today for a routine annual exam.  Current complaints: annual  Discussed the use of AI scribe software for clinical note transcription with the patient, who gave verbal consent to proceed.  History of Present Illness   Hannah Harvey is a 38 year old female who presents for an annual physical exam.  Her last Pap smear was conducted in 2023 and was normal. She has no history of abnormal Pap smears and continues to have regular menstrual cycles. She denies any breast or nipple changes.  She is currently sexually active with a female partner and is not using any formal contraception, relying on the 'pullout method'. She is open to the possibility of becoming pregnant.  She requires a refill for her blood pressure medication. She does not have a primary care provider currently and has Medicaid, which makes it challenging to find a provider.  There is no known family history of breast, ovarian, or colon cancer.  She mentions that she has not visited a doctor recently due to lack of insurance coverage last year.       Patient's last menstrual period was 06/14/2023 (approximate).   The pregnancy intention screening data noted above was reviewed. Potential methods of contraception were discussed. The patient elected to proceed with No data recorded.   Last pap 05/01/2022 NILM Last mammogram: n/a - start after 38 .  Last colonoscopy: n/a.      07/06/2023   10:51 AM  Depression screen PHQ 2/9  Decreased Interest 0  Down, Depressed, Hopeless 0  PHQ - 2 Score 0  Altered sleeping 1  Tired, decreased energy 0  Change in appetite 0  Feeling bad or failure about yourself  0  Trouble concentrating 0  Moving slowly or fidgety/restless 0  Suicidal thoughts 0  PHQ-9  Score 1        07/06/2023   10:51 AM  GAD 7 : Generalized Anxiety Score  Nervous, Anxious, on Edge 0  Control/stop worrying 0  Worry too much - different things 0  Trouble relaxing 0  Restless 0  Easily annoyed or irritable 0  Afraid - awful might happen 0  Total GAD 7 Score 0     Review of Systems:   Pertinent items are noted in HPI Denies any headaches, blurred vision, fatigue, shortness of breath, chest pain, abdominal pain, abnormal vaginal discharge/itching/odor/irritation, problems with periods, bowel movements, urination, or intercourse unless otherwise stated above. Pertinent History Reviewed:  Reviewed past medical,surgical, social and family history.  Reviewed problem list, medications and allergies. Physical Assessment:   Vitals:   07/06/23 1031  BP: 138/85  Pulse: 79  Weight: (!) 330 lb (149.7 kg)  Height: 5' 5.5" (1.664 m)  Body mass index is 54.08 kg/m.        Physical Examination:   General appearance - well appearing, and in no distress  Mental status - alert, oriented to person, place, and time  Psych:  She has a normal mood and affect  Skin - warm and dry, normal color, no suspicious lesions noted  Chest - effort normal, all lung fields clear to auscultation bilaterally  Heart - normal rate and regular rhythm  Breasts - breasts appear normal, no suspicious masses, no skin or nipple changes or  axillary  nodes  Abdomen - soft, nontender, nondistended, no masses or organomegaly  Pelvic - deferred  Extremities:  No swelling or varicosities noted  Chaperone present for exam  No results found for this or any previous visit (from the past 24 hours).    Assessment & Plan:   Assessment and Plan    Annual Well Woman Exam No abnormal Pap history, last Pap in 2023, no current gynecological complaints. Patient is sexually active with female partner, using withdrawal method for contraception, and is okay with potential pregnancy. -Next Pap due in  2026. -Optional pelvic exam today based on patient preference.  Breast Cancer Screening Patient turning 38 this year, no family history of breast, ovarian, or colon cancer. -Order mammogram to be done after 38th birthday.  Hypertension Patient on blood pressure medication, needs refill. -Refill blood pressure medication. -Recommend establishing care with a primary care provider for ongoing management.      Orders Placed This Encounter  Procedures   MM 3D SCREENING MAMMOGRAM BILATERAL BREAST   Tdap vaccine greater than or equal to 7yo IM    Meds: No orders of the defined types were placed in this encounter.   Follow-up: No follow-ups on file.  Lorriane Shire, MD 07/06/2023 10:47 AM

## 2023-07-29 ENCOUNTER — Emergency Department (HOSPITAL_BASED_OUTPATIENT_CLINIC_OR_DEPARTMENT_OTHER): Payer: Self-pay

## 2023-07-29 ENCOUNTER — Emergency Department (HOSPITAL_BASED_OUTPATIENT_CLINIC_OR_DEPARTMENT_OTHER)
Admission: EM | Admit: 2023-07-29 | Discharge: 2023-07-29 | Disposition: A | Discharge status: Home / Self Care | Payer: Self-pay | Attending: Emergency Medicine | Admitting: Emergency Medicine

## 2023-07-29 ENCOUNTER — Other Ambulatory Visit: Payer: Self-pay

## 2023-07-29 ENCOUNTER — Encounter (HOSPITAL_BASED_OUTPATIENT_CLINIC_OR_DEPARTMENT_OTHER): Payer: Self-pay | Admitting: Emergency Medicine

## 2023-07-29 ENCOUNTER — Other Ambulatory Visit: Payer: Self-pay | Admitting: Obstetrics and Gynecology

## 2023-07-29 DIAGNOSIS — M79672 Pain in left foot: Secondary | ICD-10-CM | POA: Insufficient documentation

## 2023-07-29 DIAGNOSIS — I10 Essential (primary) hypertension: Secondary | ICD-10-CM

## 2023-07-29 DIAGNOSIS — Z01419 Encounter for gynecological examination (general) (routine) without abnormal findings: Secondary | ICD-10-CM

## 2023-07-29 MED ORDER — NAPROXEN 500 MG PO TABS: 500.0000 mg | ORAL_TABLET | Freq: Two times a day (BID) | ORAL | 0 refills | Status: AC

## 2023-07-29 MED ORDER — METHYLPREDNISOLONE 4 MG PO TBPK: ORAL_TABLET | ORAL | 0 refills | Status: AC

## 2023-07-29 NOTE — Discharge Instructions (Addendum)
 Please read and follow all provided instructions.  Your diagnoses today include:  1. Left foot pain     Tests performed today include: An x-ray of the affected area - does NOT show any broken bones Vital signs. See below for your results today.   Medications prescribed:  Methylprednisone- steroid medicine   It is best to take this medication in the morning to prevent sleeping problems. If you are diabetic, monitor your blood sugar closely and stop taking Prednisone if blood sugar is over 300. Take with food to prevent stomach upset.   Naproxen - anti-inflammatory pain medication Do not exceed 500mg  naproxen every 12 hours, take with food  You have been prescribed an anti-inflammatory medication or NSAID. Take with food. Take smallest effective dose for the shortest duration needed for your pain. Stop taking if you experience stomach pain or vomiting.   Take any prescribed medications only as directed.  Home care instructions:  Follow any educational materials contained in this packet Follow R.I.C.E. Protocol: R - rest your injury  I  - use ice on injury without applying directly to skin C - compress injury with bandage or splint E - elevate the injury as much as possible  Follow-up instructions: Please follow-up with your primary care provider or the provided sports medicine provider if you continue to have significant pain in 1 week. In this case you may have a more severe injury that requires further care.   Return instructions:  Please return if your toes or feet are numb or tingling, appear gray or blue, or you have severe pain (also elevate the leg and loosen splint or wrap if you were given one) Please return to the Emergency Department if you experience worsening symptoms.  Please return if you have any other emergent concerns.  Additional Information:  Your vital signs today were: BP (!) 149/101 (BP Location: Right Wrist)   Pulse 86   Temp 98.4 F (36.9 C)   Resp  18   Ht 5\' 5"  (1.651 m)   Wt (!) 149.7 kg   LMP 06/14/2023 (Approximate)   SpO2 100%   BMI 54.91 kg/m  If your blood pressure (BP) was elevated above 135/85 this visit, please have this repeated by your doctor within one month. --------------

## 2023-07-29 NOTE — ED Triage Notes (Signed)
 Left foot pain  and swelling  can wiggle toes has good pulse and neuro since yesterday has done this before but not getting better

## 2023-07-29 NOTE — ED Provider Notes (Signed)
 [facility] Provider Note   CSN: [id number] Arrival date & time: 07/29/23  1318     History  Chief Complaint  Patient presents with   Foot Pain    Hannah Harvey is a 38 y.o. female.  Patient presents emergency department for evaluation of left foot pain.  Symptoms started 2 days ago.  No injury.  She has had pain in the past that gets better after a few days.  She was having difficulty putting weight on it today prompting emergency department visit.  No fevers.  Pain seems to be focused around the left MTP with mild swelling and redness.  No known history of gout.       Home Medications Prior to Admission medications   Medication Sig Start Date End Date Taking? Authorizing Provider  amLODipine (NORVASC) 10 MG tablet Take 1 tablet (10 mg total) by mouth daily. 04/23/18   Shon Hale, MD  benzonatate (TESSALON) 100 MG capsule Take 1 capsule (100 mg total) by mouth every 8 (eight) hours. Patient not taking: Reported on 07/06/2023 05/29/22   Arthor Captain, PA-C  cephALEXin (KEFLEX) 500 MG capsule Take 1 capsule (500 mg total) by mouth 4 (four) times daily. Patient not taking: Reported on 07/06/2023 04/27/23   Terrilee Files, MD  doxycycline (VIBRAMYCIN) 100 MG capsule Take 1 capsule (100 mg total) by mouth 2 (two) times daily. One po bid x 7 days Patient not taking: Reported on 07/06/2023 05/29/22   Arthor Captain, PA-C  ferrous sulfate 325 (65 FE) MG EC tablet Take 1 tablet (325 mg total) by mouth 2 (two) times daily with a meal. 04/23/18   Emokpae, Courage, MD  HYDROcodone bit-homatropine (HYCODAN) 5-1.5 MG/5ML syrup Take 5 mLs by mouth every 6 (six) hours as needed for cough. Patient not taking: Reported on 07/06/2023 06/19/22   Marita Kansas, PA-C  losartan (COZAAR) 100 MG tablet Take 1 tablet (100 mg total) by mouth daily. 04/23/18   Shon Hale, MD  losartan-hydrochlorothiazide (HYZAAR) 100-25 MG tablet Take 1 tablet by  mouth daily. 07/06/23 09/04/23  Lorriane Shire, MD  methylPREDNISolone (MEDROL DOSEPAK) 4 MG TBPK tablet Use as directed Patient not taking: Reported on 07/06/2023 05/29/22   Arthor Captain, PA-C  naproxen (NAPROSYN) 500 MG tablet Take 1 tablet (500 mg total) by mouth 2 (two) times daily with a meal. As needed for pain Patient not taking: Reported on 07/06/2023 07/20/20   Linwood Dibbles, MD  phenazopyridine (PYRIDIUM) 200 MG tablet Take 1 tablet (200 mg total) by mouth 3 (three) times daily. Patient not taking: Reported on 07/06/2023 04/25/23   Netta Corrigan, PA-C  Vitamin D, Ergocalciferol, (DRISDOL) 1.25 MG (50000 UT) CAPS capsule Take 50,000 Units by mouth every Monday. 03/14/18   [provider]      Allergies    Patient has no known allergies.    Review of Systems   Review of Systems  Physical Exam Updated Vital Signs BP (!) 149/101 (BP Location: Right Wrist)   Pulse 86   Temp 98.4 F (36.9 C)   Resp 18   Ht 5\' 5"  (1.651 m)   Wt (!) 149.7 kg   LMP 06/14/2023 (Approximate)   SpO2 100%   BMI 54.91 kg/m  Physical Exam Vitals and nursing note reviewed.  Constitutional:      Appearance: She is well-developed.  HENT:     Head: Normocephalic and atraumatic.  Eyes:     Pupils: Pupils are equal, round, and  reactive to light.  Cardiovascular:     Pulses: Normal pulses. No decreased pulses.  Musculoskeletal:        General: Tenderness present.     Cervical back: Normal range of motion and neck supple.     Left foot: Swelling, tenderness and bony tenderness present.     Comments: Mild erythema and warmth overlying the left MTP joint and also tenderness over the medial forefoot without swelling.  No signs of cellulitis.  Ranging the MTP joint causes significant pain.  Skin:    General: Skin is warm and dry.  Neurological:     Mental Status: She is alert.     Sensory: No sensory deficit.     Comments: Motor, sensation, and vascular distal to the injury is fully intact.    Psychiatric:        Mood and Affect: Mood normal.     ED Results / Procedures / Treatments   Labs (all labs ordered are listed, but only abnormal results are displayed) Labs Reviewed - No data to display  EKG None  Radiology DG Foot Complete Left Result Date: 07/29/2023 CLINICAL DATA:  Left foot pain and swelling. EXAM: LEFT FOOT - COMPLETE 3+ VIEW COMPARISON:  None Available. FINDINGS: No acute fracture or dislocation. No aggressive osseous lesion. No significant arthritis of imaged joints. No focal soft tissue swelling. No radiopaque foreign bodies. IMPRESSION: No acute osseous abnormality of the left foot. Electronically Signed   By: Jules Schick M.D.   On: 07/29/2023 14:11    Procedures Procedures    Medications Ordered in ED Medications - No data to display  ED Course/ Medical Decision Making/ A&P    Patient seen and examined. History obtained directly from patient.   Labs/EKG: None ordered  Imaging: Ordered x-ray of the left foot  Medications/Fluids: None ordered  Most recent vital signs reviewed and are as follows: BP (!) 149/101 (BP Location: Right Wrist)   Pulse 86   Temp 98.4 F (36.9 C)   Resp 18   Ht 5\' 5"  (1.651 m)   Wt (!) 149.7 kg   LMP 06/14/2023 (Approximate)   SpO2 100%   BMI 54.91 kg/m   Initial impression: Left foot pain, centered around the MTP.  No constitutional symptoms or suspicious findings to suggest septic arthritis.  Clinically consistent with gouty arthritis.  If x-ray negative, will likely trial prednisone and some NSAIDs.  2:31 PM Reassessment performed. Patient appears stable.  Imaging personally visualized and interpreted including: X-ray of foot agree negative  Reviewed pertinent lab work and imaging with patient at bedside. Questions answered.   Most current vital signs reviewed and are as follows: BP (!) 149/101 (BP Location: Right Wrist)   Pulse 86   Temp 98.4 F (36.9 C)   Resp 18   Ht 5\' 5"  (1.651 m)   Wt (!)  149.7 kg   LMP 06/14/2023 (Approximate)   SpO2 100%   BMI 54.91 kg/m   Plan: Discharge to home.   Prescriptions written for: Methylprednisone, naproxen  Other home care instructions discussed: Rest, ice, avoidance of triggers  ED return instructions discussed: Worsening pain, fever, inability to bear weight  Follow-up instructions discussed: Patient encouraged to follow-up with their PCP or sports medicine referral in 7 days.                                 Medical Decision Making Amount and/or Complexity of Data  Reviewed Radiology: ordered.  Risk Prescription drug management.   Patient with left foot pain, focused around the left first MTP with mild erythema and swelling.  Clinically suggestive of gout.  Low concern for septic arthritis or cellulitis.  Will trial steroids and NSAIDs.  If not improving, should follow-up with sports medicine for further evaluation.        Final Clinical Impression(s) / ED Diagnoses Final diagnoses:  Left foot pain    Rx / DC Orders ED Discharge Orders          Ordered    methylPREDNISolone (MEDROL DOSEPAK) 4 MG TBPK tablet        07/29/23 1429    naproxen (NAPROSYN) 500 MG tablet  2 times daily with meals        07/29/23 1429              Renne Crigler, PA-C 07/29/23 1432    Anders Simmonds T, DO 07/31/23 [phone]

## 2023-07-30 NOTE — Telephone Encounter (Signed)
 Patient needs to follow up with a primary care provider

## 2023-09-24 ENCOUNTER — Inpatient Hospital Stay (HOSPITAL_BASED_OUTPATIENT_CLINIC_OR_DEPARTMENT_OTHER): Admission: RE | Admit: 2023-09-24 | Payer: Medicaid Other | Source: Ambulatory Visit

## 2023-10-09 ENCOUNTER — Encounter (HOSPITAL_BASED_OUTPATIENT_CLINIC_OR_DEPARTMENT_OTHER): Payer: Self-pay

## 2023-10-09 ENCOUNTER — Ambulatory Visit (HOSPITAL_BASED_OUTPATIENT_CLINIC_OR_DEPARTMENT_OTHER)

## 2023-10-25 ENCOUNTER — Ambulatory Visit (HOSPITAL_BASED_OUTPATIENT_CLINIC_OR_DEPARTMENT_OTHER)

## 2023-10-25 ENCOUNTER — Encounter (HOSPITAL_BASED_OUTPATIENT_CLINIC_OR_DEPARTMENT_OTHER): Payer: Self-pay

## 2023-11-14 ENCOUNTER — Telehealth (HOSPITAL_BASED_OUTPATIENT_CLINIC_OR_DEPARTMENT_OTHER): Payer: Self-pay

## 2023-11-25 ENCOUNTER — Emergency Department (HOSPITAL_BASED_OUTPATIENT_CLINIC_OR_DEPARTMENT_OTHER)
Admission: EM | Admit: 2023-11-25 | Discharge: 2023-11-25 | Disposition: A | Discharge status: Home / Self Care | Payer: Self-pay | Attending: Emergency Medicine | Admitting: Emergency Medicine

## 2023-11-25 ENCOUNTER — Encounter (HOSPITAL_BASED_OUTPATIENT_CLINIC_OR_DEPARTMENT_OTHER): Payer: Self-pay | Admitting: Emergency Medicine

## 2023-11-25 ENCOUNTER — Other Ambulatory Visit: Payer: Self-pay

## 2023-11-25 ENCOUNTER — Other Ambulatory Visit: Payer: Self-pay | Admitting: Obstetrics and Gynecology

## 2023-11-25 DIAGNOSIS — H6690 Otitis media, unspecified, unspecified ear: Secondary | ICD-10-CM

## 2023-11-25 DIAGNOSIS — I10 Essential (primary) hypertension: Secondary | ICD-10-CM

## 2023-11-25 DIAGNOSIS — Z01419 Encounter for gynecological examination (general) (routine) without abnormal findings: Secondary | ICD-10-CM

## 2023-11-25 DIAGNOSIS — H6691 Otitis media, unspecified, right ear: Secondary | ICD-10-CM | POA: Insufficient documentation

## 2023-11-25 MED ORDER — AMOXICILLIN-POT CLAVULANATE 875-125 MG PO TABS: 1.0000 | ORAL_TABLET | Freq: Two times a day (BID) | ORAL | 0 refills | Status: AC

## 2023-11-25 MED ORDER — AMOXICILLIN-POT CLAVULANATE 875-125 MG PO TABS
1.0000 | ORAL_TABLET | Freq: Once | ORAL | Status: AC
  Administered 2023-11-25: 1 via ORAL
  Filled 2023-11-25: qty 1

## 2023-11-25 NOTE — Discharge Instructions (Signed)
 Today you were seen for right ear pain/irritation.  You were found to have acute otitis media.  Please pick up your antibiotic and take as prescribed.  Please return to the ED if you have worsening symptoms, fever that does not go down with Tylenol  or Motrin, or uncontrollable vomiting.  Thank you for letting us  treat you today. After performing a physical exam, I feel you are safe to go home. Please follow up with your PCP in the next several days and provide them with your records from this visit. Return to the Emergency Room if pain becomes severe or symptoms worsen.

## 2023-11-25 NOTE — ED Notes (Signed)
 Reviewed discharge instructions , follow up and medications with pt. Pt states understanding

## 2023-11-25 NOTE — ED Provider Notes (Signed)
 [place] [facility] Provider Note   CSN: [id number] Arrival date & time: 11/25/23  1349     Patient presents with: Foreign Body in Ear   Hannah Harvey is a 38 y.o. female presents today for right ear discomfort and irritation since Friday.  Patient is concerned she may have gotten that continent of a Q-tip stuck in her ear.  Patient denies fever, chills, nausea, vomiting, cough, ear discharge, hearing changes, congestion, chest pain, or shortness of breath.    Foreign Body in Ear       Prior to Admission medications   Medication Sig Start Date End Date Taking? Authorizing Provider  amoxicillin -clavulanate (AUGMENTIN ) 875-125 MG tablet Take 1 tablet by mouth every 12 (twelve) hours. 11/25/23  Yes Janyah Singleterry N, PA-C  ferrous sulfate  325 (65 FE) MG EC tablet Take 1 tablet (325 mg total) by mouth 2 (two) times daily with a meal. 04/23/18   Emokpae, Courage, MD  losartan  (COZAAR ) 100 MG tablet Take 1 tablet (100 mg total) by mouth daily. 04/23/18   Pearlean Manus, MD  losartan -hydrochlorothiazide (HYZAAR) 100-25 MG tablet TAKE 1 TABLET BY MOUTH EVERY DAY 07/30/23   Ajewole, Christana, MD  methylPREDNISolone  (MEDROL  DOSEPAK) 4 MG TBPK tablet Use as directed 07/29/23   Geiple, Joshua, PA-C  naproxen  (NAPROSYN ) 500 MG tablet Take 1 tablet (500 mg total) by mouth 2 (two) times daily with a meal. As needed for pain 07/29/23   Desiderio Chew, PA-C  Vitamin D, Ergocalciferol, (DRISDOL) 1.25 MG (50000 UT) CAPS capsule Take 50,000 Units by mouth every Monday. 03/14/18   [provider]    Allergies: Patient has no known allergies.    Review of Systems  HENT:  Positive for ear pain.     Updated Vital Signs BP (!) 147/97 (BP Location: Right Arm)   Pulse 85   Temp 97.8 F (36.6 C)   Resp 18   Ht 5' 5 (1.651 m)   Wt (!) 149.7 kg   SpO2 96%   BMI 54.92 kg/m   Physical Exam Vitals and nursing note reviewed.  Constitutional:       General: She is not in acute distress.    Appearance: Normal appearance. She is well-developed. She is not ill-appearing.  HENT:     Head: Normocephalic and atraumatic.     Right Ear: External ear normal. Tenderness present. No foreign body. No mastoid tenderness.     Left Ear: Tympanic membrane and external ear normal. No foreign body. No mastoid tenderness.     Ears:     Comments: Right TM erythematous and opacified, concerning for acute otitis media Eyes:     Conjunctiva/sclera: Conjunctivae normal.  Cardiovascular:     Rate and Rhythm: Normal rate and regular rhythm.     Pulses: Normal pulses.     Heart sounds: Normal heart sounds. No murmur heard. Pulmonary:     Effort: Pulmonary effort is normal. No respiratory distress.     Breath sounds: Normal breath sounds.  Abdominal:     Palpations: Abdomen is soft.     Tenderness: There is no abdominal tenderness.  Musculoskeletal:        General: No swelling.     Cervical back: Neck supple.  Skin:    General: Skin is warm and dry.     Capillary Refill: Capillary refill takes less than 2 seconds.  Neurological:     General: No focal deficit present.     Mental Status: She  is alert and oriented to person, place, and time.  Psychiatric:        Mood and Affect: Mood normal.     (all labs ordered are listed, but only abnormal results are displayed) Labs Reviewed - No data to display  EKG: None  Radiology: No results found.   Procedures   Medications Ordered in the ED  amoxicillin -clavulanate (AUGMENTIN ) 875-125 MG per tablet 1 tablet (has no administration in time range)                                    Medical Decision Making Risk Prescription drug management.   This patient presents to the ED for concern of right ear pain differential diagnosis includes cerumen impaction, foreign body, acute otitis media, acute otitis externa, malignant otitis externa    Medicines ordered and prescription drug  management:  I ordered medication including Augmentin     I have reviewed the patients home medicines and have made adjustments as needed   Problem List / ED Course:  Considered for admission or further workup however patient's vital signs and physical exam are reassuring.  Patient's symptoms likely due to acute otitis media of the right ear.  Patient given outpatient course of Augmentin .  Patient given return precautions.  I feel patient safe for discharge at this time.        Final diagnoses:  Acute otitis media, unspecified otitis media type    ED Discharge Orders          Ordered    amoxicillin -clavulanate (AUGMENTIN ) 875-125 MG tablet  Every 12 hours        11/25/23 1452               Francis Ileana SAILOR, PA-C 11/25/23 1456    Tegeler, Lonni PARAS, MD 11/25/23 [phone]

## 2023-11-25 NOTE — ED Triage Notes (Signed)
 Pt thinks she might have the cotton from a Q-tip in her RT ear since Fri

## 2023-11-28 ENCOUNTER — Other Ambulatory Visit: Payer: Self-pay | Admitting: Obstetrics and Gynecology

## 2023-11-28 DIAGNOSIS — I10 Essential (primary) hypertension: Secondary | ICD-10-CM

## 2024-01-25 IMAGING — US US ABDOMEN LIMITED
1 series · 14 of 25 positions shown · non-contrast
Comparison: None Available.

CLINICAL DATA: Transaminitis

EXAM:
ULTRASOUND ABDOMEN LIMITED RIGHT UPPER QUADRANT

[Series 1: us abdomen limited · 0.30mm/px · 57 acquisitions, 14 frames shown]
[im 1/57]
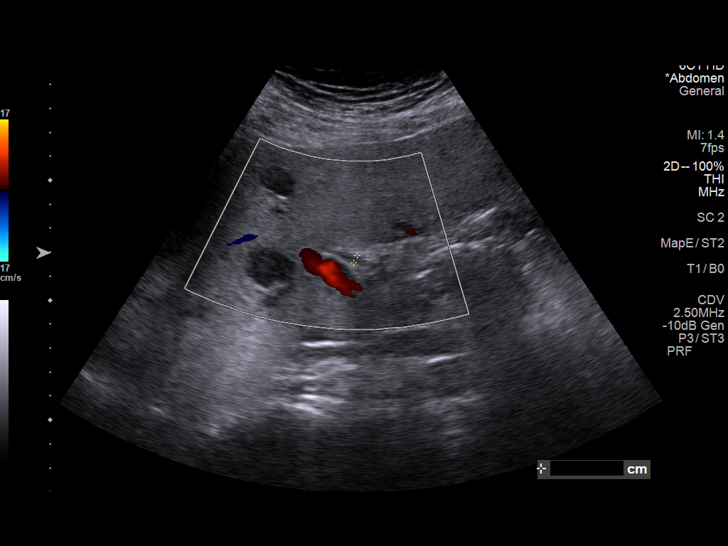
[im 5/57]
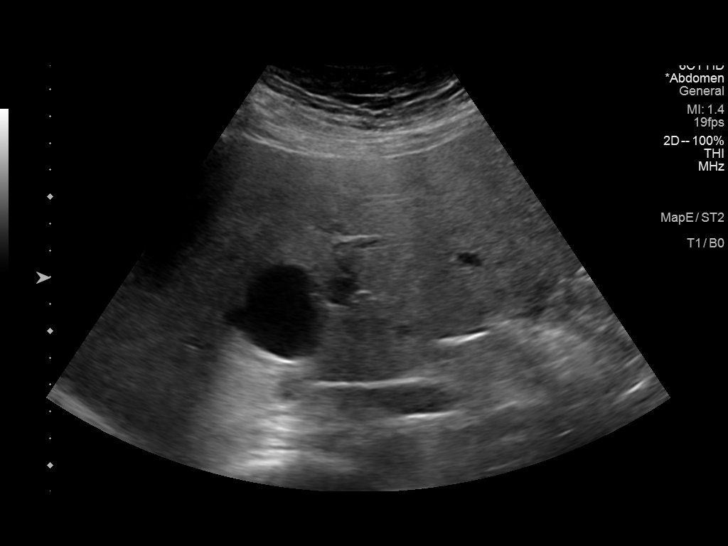
[im 10/57]
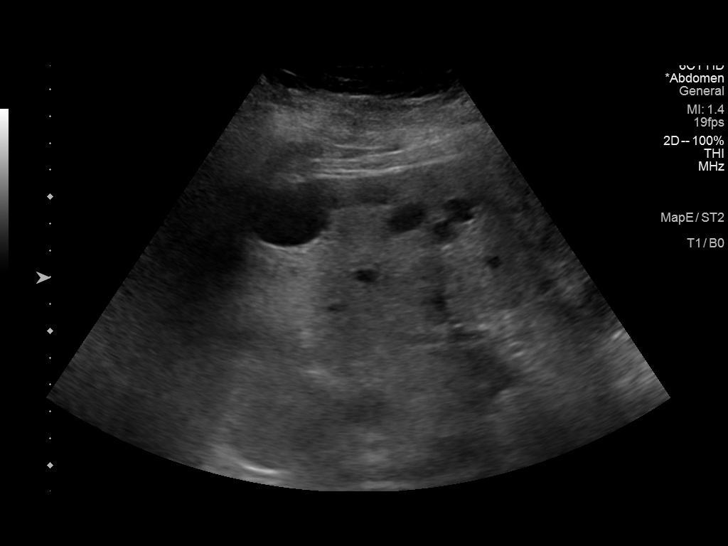
[im 15/57]
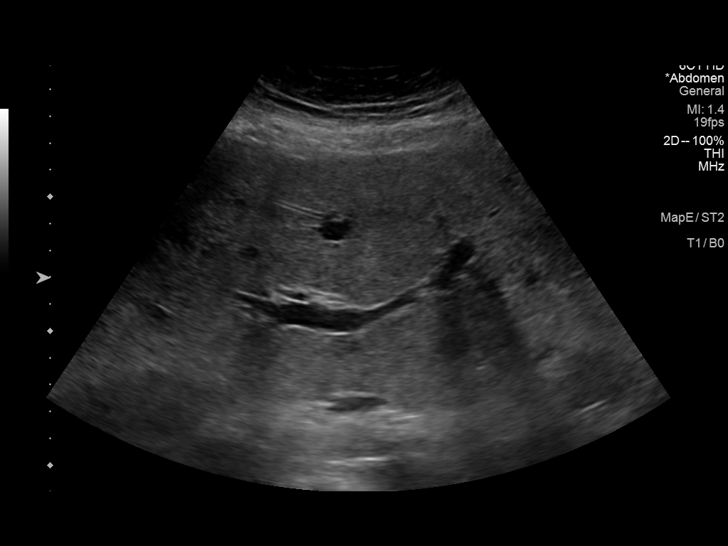
[im 19/57]
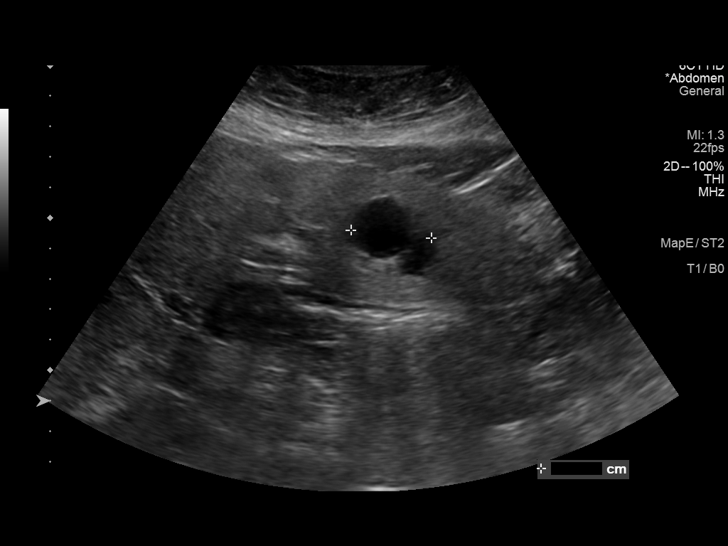
[im 22/57]
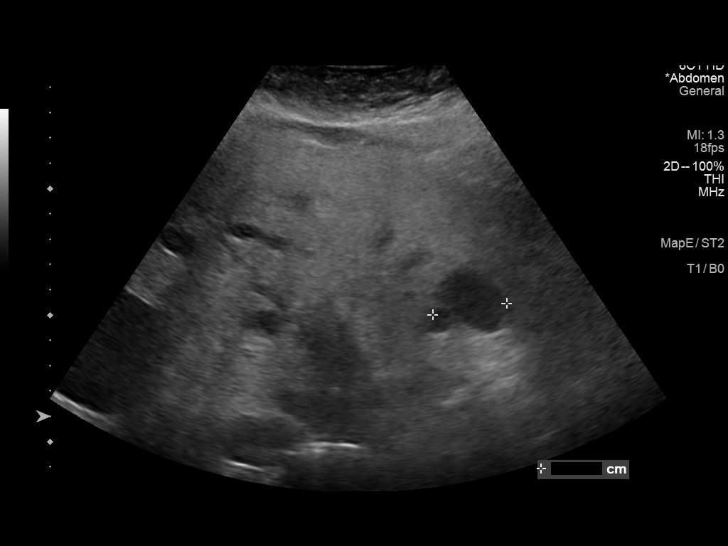
[im 26/57]
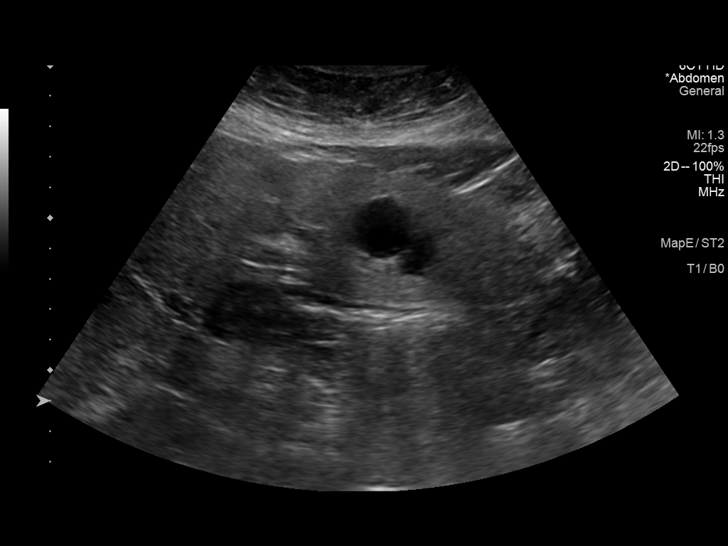
[im 31/57]
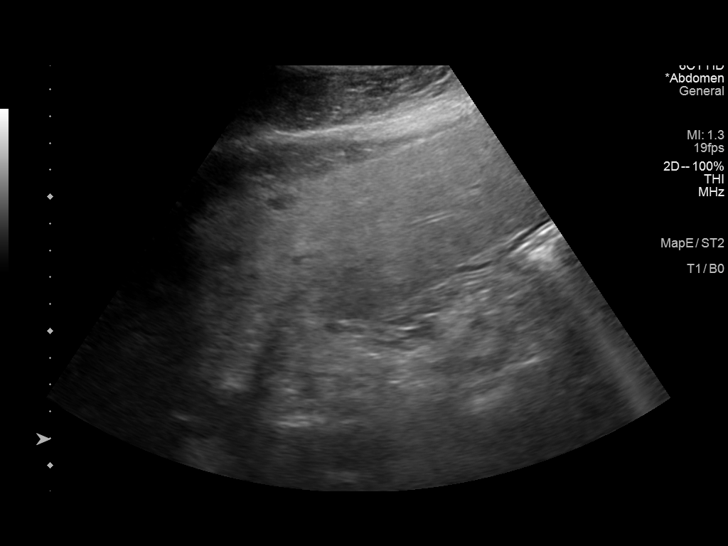
[im 36/57]
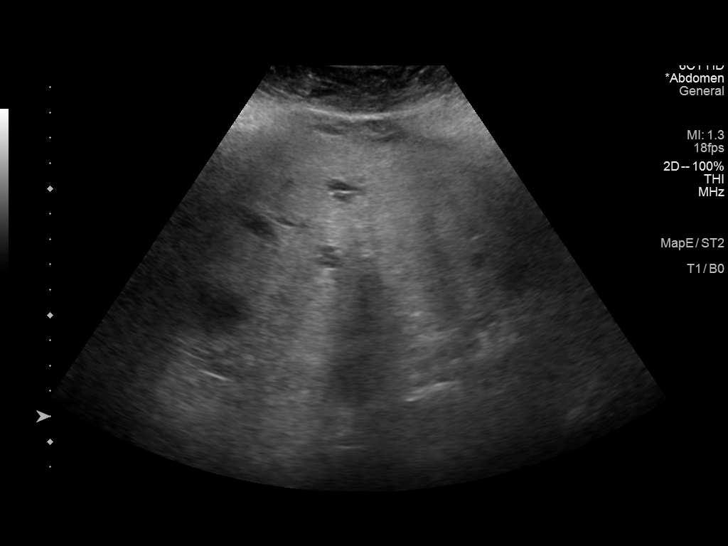
[im 38/57]
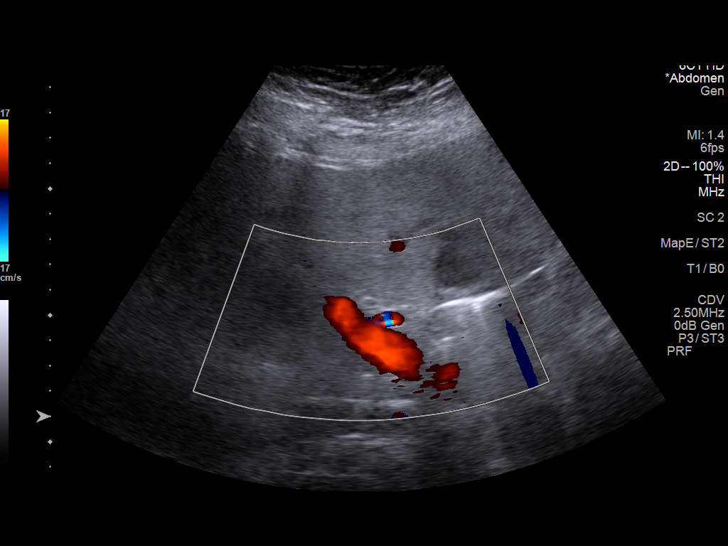
[im 43/57]
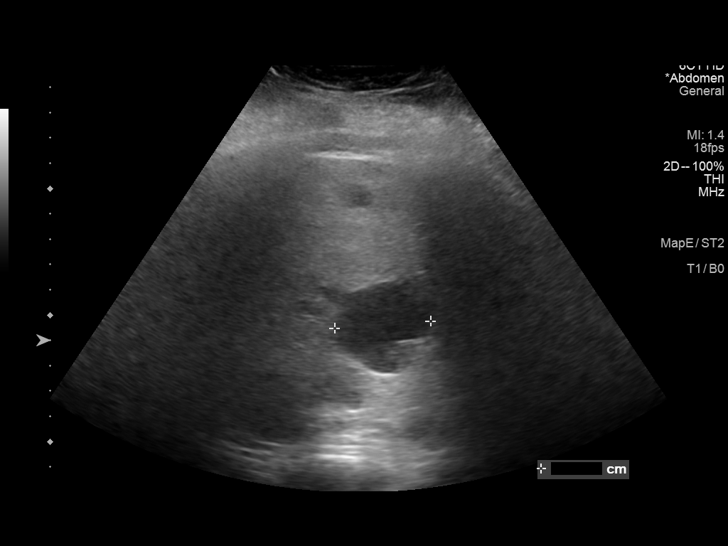
[im 47/57]
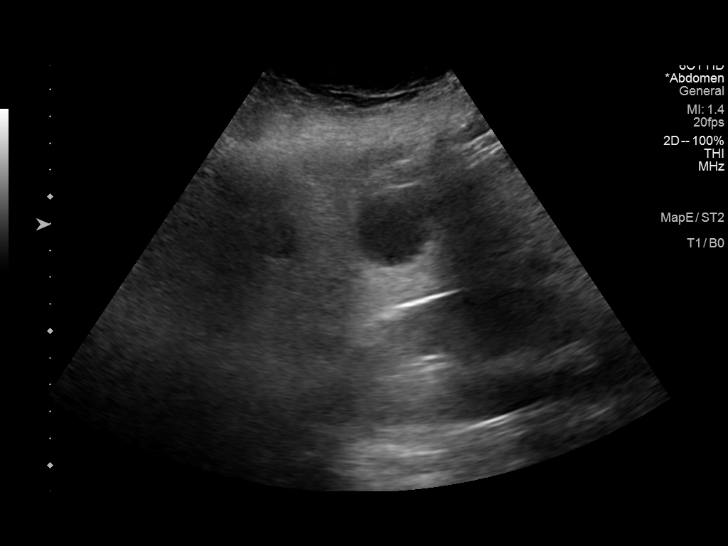
[im 52/57]
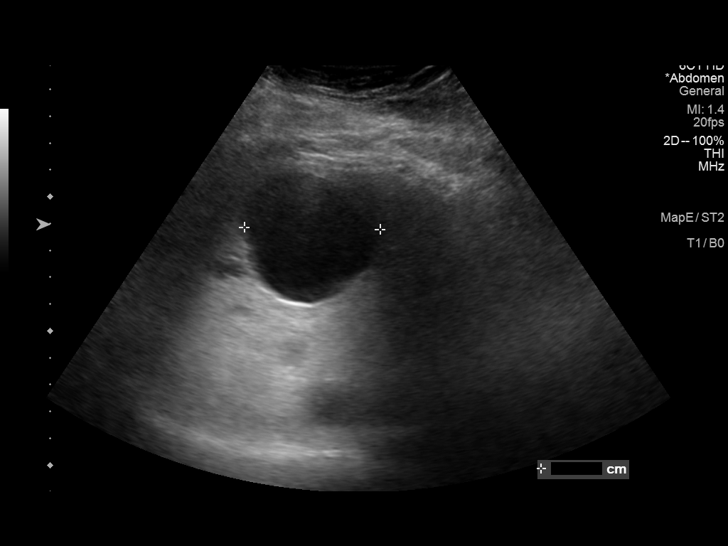
[im 57/57]
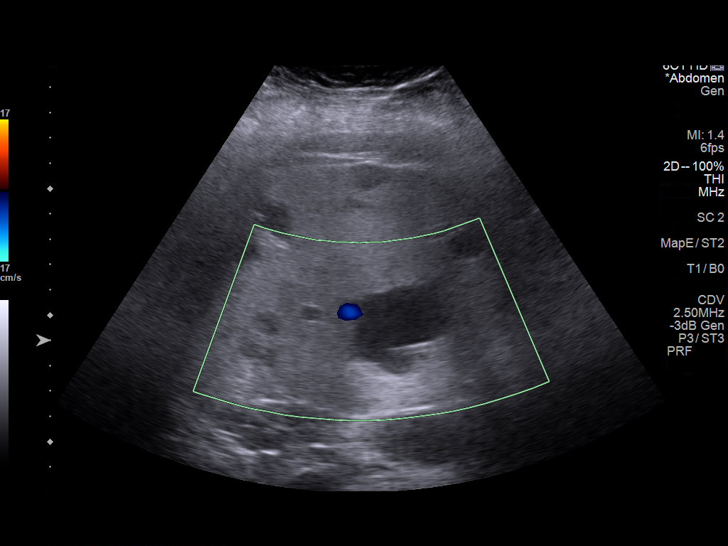

[14 of 25 positions shown; findings below may reference images not displayed]

FINDINGS: Gallbladder:

Surgically absent.

Common bile duct:

Diameter: 3 mm

Liver:

Inhomogeneous increased echogenicity of the parenchyma. Multiple
anechoic cysts visualized measuring up to 5.5 cm in size in the
right lobe and 3 cm in the left lobe, some of which appear to have
thin internal partial septations. Portal vein is patent on color
Doppler imaging with normal direction of blood flow towards the
liver.

Other: None.
IMPRESSION: 1. Abnormal appearance of the liver parenchyma suggesting hepatic
steatosis and/or other hepatocellular disease.
2. Multiple simple and mildly complex hepatic cysts.

## 2024-04-13 ENCOUNTER — Other Ambulatory Visit: Payer: Self-pay | Admitting: Obstetrics and Gynecology

## 2024-04-13 DIAGNOSIS — Z01419 Encounter for gynecological examination (general) (routine) without abnormal findings: Secondary | ICD-10-CM

## 2024-04-13 DIAGNOSIS — I10 Essential (primary) hypertension: Secondary | ICD-10-CM

## 2024-05-13 ENCOUNTER — Other Ambulatory Visit (HOSPITAL_BASED_OUTPATIENT_CLINIC_OR_DEPARTMENT_OTHER): Payer: Self-pay | Admitting: Obstetrics and Gynecology

## 2024-05-13 DIAGNOSIS — Z1231 Encounter for screening mammogram for malignant neoplasm of breast: Secondary | ICD-10-CM

## 2024-05-20 ENCOUNTER — Ambulatory Visit (HOSPITAL_BASED_OUTPATIENT_CLINIC_OR_DEPARTMENT_OTHER)
Admission: RE | Admit: 2024-05-20 | Discharge: 2024-05-20 | Disposition: A | Discharge status: Home / Self Care | Source: Ambulatory Visit | Attending: Obstetrics and Gynecology | Admitting: Obstetrics and Gynecology

## 2024-05-20 ENCOUNTER — Encounter (HOSPITAL_BASED_OUTPATIENT_CLINIC_OR_DEPARTMENT_OTHER): Payer: Self-pay

## 2024-05-20 DIAGNOSIS — Z1231 Encounter for screening mammogram for malignant neoplasm of breast: Secondary | ICD-10-CM | POA: Diagnosis present

## 2024-05-22 ENCOUNTER — Ambulatory Visit: Payer: Self-pay | Admitting: Obstetrics and Gynecology
